# Patient Record
Sex: Female | Born: 2000 | Race: White | Hispanic: No | Marital: Single | Smoking: Never smoker
Health system: Southern US, Community
[De-identification: ages and names within clinical notes are randomized; demographics above are authoritative.]

## PROBLEM LIST (undated history)

## (undated) DIAGNOSIS — E282 Polycystic ovarian syndrome: Secondary | ICD-10-CM

## (undated) DIAGNOSIS — G8194 Hemiplegia, unspecified affecting left nondominant side: Secondary | ICD-10-CM

## (undated) DIAGNOSIS — G809 Cerebral palsy, unspecified: Secondary | ICD-10-CM

## (undated) DIAGNOSIS — G919 Hydrocephalus, unspecified: Secondary | ICD-10-CM

---
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
## 2021-04-09 ENCOUNTER — Ambulatory Visit: Payer: Self-pay | Admitting: Family Medicine

## 2021-04-09 NOTE — Progress Notes (Deleted)
      Established patient visit   Patient: Jasmin Reynolds   DOB: November 02, 2000   20 y.o. Female  MRN: [id number] Visit Date: 04/09/2021  Today's healthcare provider: Wilhemena Durie, MD   No chief complaint on file.  Subjective    HPI  Skin Lesion  {Show patient history (optional):23778}   Medications: No outpatient medications prior to visit.   No facility-administered medications prior to visit.    Review of Systems  Skin:  Negative for color change, pallor, rash and wound.  Allergic/Immunologic: Negative for environmental allergies.   {Labs  Heme  Chem  Endocrine  Serology  Results Review (optional):23779}   Objective    There were no vitals taken for this visit. {Show previous vital signs (optional):23777}   Physical Exam  ***  No results found for any visits on 04/09/21.  Assessment & Plan     ***  No follow-ups on file.      {provider attestation***:1}   Wilhemena Durie, MD  [facility] [phone] (phone) [phone] (fax)  [place]

## 2021-07-28 ENCOUNTER — Other Ambulatory Visit: Payer: Self-pay

## 2021-07-28 ENCOUNTER — Encounter: Payer: Self-pay | Admitting: Family Medicine

## 2021-07-28 ENCOUNTER — Ambulatory Visit (INDEPENDENT_AMBULATORY_CARE_PROVIDER_SITE_OTHER): Payer: Medicaid Other | Admitting: Family Medicine

## 2021-07-28 VITALS — BP 118/84 | HR 94 | Resp 16 | Wt 182.0 lb

## 2021-07-28 DIAGNOSIS — H6123 Impacted cerumen, bilateral: Secondary | ICD-10-CM

## 2021-07-28 DIAGNOSIS — D229 Melanocytic nevi, unspecified: Secondary | ICD-10-CM

## 2021-07-28 DIAGNOSIS — G809 Cerebral palsy, unspecified: Secondary | ICD-10-CM | POA: Diagnosis not present

## 2021-07-28 DIAGNOSIS — G919 Hydrocephalus, unspecified: Secondary | ICD-10-CM

## 2021-07-28 DIAGNOSIS — E282 Polycystic ovarian syndrome: Secondary | ICD-10-CM

## 2021-07-28 MED ORDER — METFORMIN HCL 1000 MG PO TABS: 1000.0000 mg | ORAL_TABLET | Freq: Two times a day (BID) | ORAL | 1 refills | Status: DC

## 2021-07-28 NOTE — Progress Notes (Signed)
I,April Miller,acting as a scribe for Wilhemena Durie, MD.,have documented all relevant documentation on the behalf of Wilhemena Durie, MD,as directed by  Wilhemena Durie, MD while in the presence of Wilhemena Durie, MD.   Established patient visit   Patient: Jasmin Reynolds   DOB: [birth year]   20 y.o. Female  MRN: [id number] Visit Date: 07/28/2021  Today's healthcare provider: Wilhemena Durie, MD   No chief complaint on file.  Subjective    HPI   Patient is here to get referrals to Neurology, Endocrinology, and Dermatology ( Skin).  Patient has a history of infantile cerebral palsy and hydrocephalus along with PCOS.  She is brought in by her mother today.  They state her ears are little congested without discomfort. She has some moles for which she would like to see dermatology.  She has seen them in the past.  Would write referral for her PCOS to endocrine and to neurology/neurosurgery for cerebral palsy/hydrocephalus/shunt. Overall she is feeling well today and is in good spirits.   Medications: No outpatient medications prior to visit.   No facility-administered medications prior to visit.    Review of Systems      Objective    BP 118/84 (BP Location: Right Arm, Patient Position: Sitting, Cuff Size: Large)   Pulse 94   Resp 16   Wt 182 lb (82.6 kg)   SpO2 99%  BP Readings from Last 3 Encounters:  07/28/21 118/84  10/30/19 128/82   Wt Readings from Last 3 Encounters:  07/28/21 182 lb (82.6 kg) (95 %, Z= 1.64)*  10/30/19 175 lb 6.4 oz (79.6 kg) (95 %, Z= 1.60)*   * Growth percentiles are based on CDC (Girls, 2-20 Years) data.      Physical Exam Vitals reviewed.  HENT:     Head: Atraumatic.     Right Ear: External ear normal.     Left Ear: External ear normal.     Ears:     Comments: Both EACs partially occluded with cerumen    Mouth/Throat:     Pharynx: Oropharynx is clear.  Eyes:     General: No scleral icterus.     Conjunctiva/sclera: Conjunctivae normal.  Cardiovascular:     Rate and Rhythm: Normal rate and regular rhythm.     Heart sounds: Normal heart sounds.  Pulmonary:     Effort: Pulmonary effort is normal.     Breath sounds: Normal breath sounds.  Abdominal:     General: Bowel sounds are normal.     Palpations: Abdomen is soft.     Tenderness: There is no abdominal tenderness.  Skin:    General: Skin is warm and dry.  Neurological:     Mental Status: She is alert and oriented to person, place, and time. Mental status is at baseline.  Psychiatric:        Mood and Affect: Mood normal.        Behavior: Behavior normal.      No results found for any visits on 07/28/21.  Assessment & Plan     1. 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 PCOS (polycystic ovarian syndrome) Restart metformin and refer to endocrinology - Ambulatory referral to Endocrinology  3. Numerous moles Refer to dermatology - Ambulatory referral to Dermatology  4. Hydrocephalus with operating shunt [facility]) Refer back to neurology for guidance with treatment. - Ambulatory referral to Neurology  5. Excessive cerumen in  both ear canals Irrigated.  Mother is given instructions with [facility]   No follow-ups on file.      I, Wilhemena Durie, MD, have reviewed all documentation for this visit. The documentation on 07/30/21 for the exam, diagnosis, procedures, and orders are all accurate and complete.    Io Dieujuste Cranford Mon, MD  [facility] [phone] (phone) [phone] (fax)  [place]

## 2021-07-28 NOTE — Patient Instructions (Signed)
TRY OVER-THE-COUNTER DEBROX FOR EAR WAX.

## 2021-09-26 ENCOUNTER — Ambulatory Visit
Admission: RE | Admit: 2021-09-26 | Discharge: 2021-09-26 | Disposition: A | Discharge status: Home / Self Care | Payer: Medicaid Other | Source: Ambulatory Visit | Attending: Family Medicine | Admitting: Family Medicine

## 2021-09-26 VITALS — BP 128/83 | HR 100 | Temp 99.3°F | Resp 16

## 2021-09-26 DIAGNOSIS — N760 Acute vaginitis: Secondary | ICD-10-CM | POA: Diagnosis not present

## 2021-09-26 HISTORY — DX: Hemiplegia, unspecified affecting left nondominant side: G81.94

## 2021-09-26 HISTORY — DX: Hydrocephalus, unspecified: G91.9

## 2021-09-26 HISTORY — DX: Polycystic ovarian syndrome: E28.2

## 2021-09-26 HISTORY — DX: Cerebral palsy, unspecified: G80.9

## 2021-09-26 MED ORDER — FLUCONAZOLE 150 MG PO TABS: 150.0000 mg | ORAL_TABLET | ORAL | 0 refills | Status: DC | PRN

## 2021-09-26 NOTE — Discharge Instructions (Signed)
Vaginal cytology will result within 2-3 days. Someone from our office will contact you if any of the results are abnormal and requires treatment.

## 2021-09-26 NOTE — ED Triage Notes (Signed)
Pt presents with vaginal itching and discharge that started yesterday.

## 2021-09-26 NOTE — ED Provider Notes (Signed)
Roderic Palau    CSN: [id number] Arrival date & time: 09/26/21  8413      History   Chief Complaint Chief Complaint  Patient presents with   Vaginal Discharge   Vaginal Itching    HPI Jasmin Reynolds is a 21 y.o. female.   HPI Patient with history of cerebral palsy and left hemiplegia presents today with vagina itching and irritation. Endorses burning of vaginal skin with urination. (Unable at present to provide a sample). Prior history of BV and vaginitis. No history of UTI.  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 No pertinent surgical history.  OB History   No obstetric history on file.      Home Medications    Prior to Admission medications   Medication Sig Start Date End Date Taking? Authorizing Provider  fluconazole (DIFLUCAN) 150 MG tablet Take 1 tablet (150 mg total) by mouth every three (3) days as needed. 09/26/21  Yes Scot Jun, FNP  metFORMIN (GLUCOPHAGE) 1000 MG tablet Take 1 tablet (1,000 mg total) by mouth 2 (two) times daily with a meal. 07/28/21  Yes Jerrol Banana., MD    Family History History reviewed. No pertinent family history.  Social History Social History   Tobacco Use   Smoking status: Never   Smokeless tobacco: Never  Vaping Use   Vaping Use: Never used  Substance Use Topics   Alcohol use: Never   Drug use: Never     Allergies   Patient has no known allergies.   Review of Systems Review of Systems Pertinent negatives listed in HPI'  Physical Exam Triage Vital Signs ED Triage Vitals  Enc Vitals Group     BP 09/26/21 0943 128/83      Pulse Rate 09/26/21 0943 100     Resp 09/26/21 0943 16     Temp 09/26/21 0943 99.3 F (37.4 C)     Temp Source 09/26/21 0943 Oral     SpO2 09/26/21 0943 96 %     Weight --      Height --      Head Circumference --      Peak Flow --      Pain Score 09/26/21 0937 0     Pain Loc --      Pain Edu? --      Excl. in [place]? --    No data found.  Updated Vital Signs BP 128/83 (BP Location: Right Arm)    Pulse 100    Temp 99.3 F (37.4 C) (Oral)    Resp 16    LMP  (LMP Unknown)    SpO2 96%   Visual Acuity Right Eye Distance:   Left Eye Distance:   Bilateral Distance:    Right Eye Near:   Left Eye Near:    Bilateral Near:     Physical Exam General appearance: Alert, well developed, well nourished, cooperative  Head: Normocephalic, without obvious abnormality, atraumatic Heart: rate and rhythm normal. No gallop or murmurs noted on exam Respiratory: Respirations even and unlabored, normal respiratory rate Abdomen: No CVA tenderness  Extremities:  No gross deformities Skin: Skin color, texture, turgor normal. No rashes seen  Psych: Appropriate mood and affect. Vaginal Cytology Self-Collected   UC Treatments / Results  Labs (all labs ordered are listed, but only abnormal results are displayed) Labs Reviewed  CERVICOVAGINAL ANCILLARY ONLY    EKG   Radiology No results found.  Procedures Procedures (including critical care time)  Medications Ordered in UC Medications - No data to display  Initial Impression / Assessment and Plan / UC Course  I have reviewed the triage vital signs and the nursing notes.  Pertinent labs & imaging results that were available during my care of the patient were reviewed by me and considered in my medical decision making (see chart for details).    Vaginitis, tx with Diflucan  Vaginal Cytology pending. Unable to provide urine. Therefore treating vaginitis symptoms only RTC as needed. Final Clinical Impressions(s) / UC Diagnoses   Final  diagnoses:  Vaginitis and vulvovaginitis     Discharge Instructions      Vaginal cytology will result within 2-3 days. Someone from our office will contact you if any of the results are abnormal and requires treatment.      ED Prescriptions     Medication Sig Dispense Auth. Provider   fluconazole (DIFLUCAN) 150 MG tablet Take 1 tablet (150 mg total) by mouth every three (3) days as needed. 2 tablet Scot Jun, FNP      PDMP not reviewed this encounter.   Scot Jun, FNP 09/26/21 1008

## 2021-09-28 LAB — CERVICOVAGINAL ANCILLARY ONLY
Bacterial Vaginitis (gardnerella): NEGATIVE
Candida Glabrata: NEGATIVE
Candida Vaginitis: NEGATIVE
Chlamydia: NEGATIVE
Comment: NEGATIVE
Comment: NEGATIVE
Comment: NEGATIVE
Comment: NEGATIVE
Comment: NEGATIVE
Comment: NORMAL
Neisseria Gonorrhea: NEGATIVE
Trichomonas: NEGATIVE

## 2021-10-06 ENCOUNTER — Telehealth: Payer: Self-pay

## 2021-10-06 NOTE — Telephone Encounter (Signed)
Copied from [place] [id number]. Topic: General - Other >> Oct 06, 2021  3:40 PM Parke Poisson wrote: Reason for CRM: Referral was sent to [facility] endocrinology for PCOS. They state they are needing lab work with the last 12 months before appointment can be set up,Thanks

## 2021-10-07 ENCOUNTER — Other Ambulatory Visit: Payer: Self-pay

## 2021-10-07 DIAGNOSIS — G919 Hydrocephalus, unspecified: Secondary | ICD-10-CM

## 2021-10-21 ENCOUNTER — Other Ambulatory Visit: Payer: Self-pay | Admitting: Family Medicine

## 2021-10-21 DIAGNOSIS — G919 Hydrocephalus, unspecified: Secondary | ICD-10-CM

## 2021-10-21 NOTE — Telephone Encounter (Signed)
Pt will need updated labs,Thanks

## 2021-10-22 ENCOUNTER — Other Ambulatory Visit: Payer: Self-pay | Admitting: Physician Assistant

## 2021-10-22 DIAGNOSIS — E282 Polycystic ovarian syndrome: Secondary | ICD-10-CM

## 2021-10-22 NOTE — Telephone Encounter (Signed)
Advised 

## 2021-10-22 NOTE — Progress Notes (Signed)
PCOS labs required before she can sched appt with endo

## 2021-11-02 ENCOUNTER — Ambulatory Visit: Payer: Medicaid Other | Admitting: Dermatology

## 2021-12-14 ENCOUNTER — Ambulatory Visit: Payer: Medicaid Other | Admitting: Dermatology

## 2021-12-31 ENCOUNTER — Ambulatory Visit: Payer: Medicaid Other | Admitting: Family Medicine

## 2021-12-31 NOTE — Progress Notes (Deleted)
?  ? ? ?  Established patient visit ? ? ?Patient: Jasmin Reynolds   DOB: 05-Nov-2000   21 y.o. Female  MRN: [id number] ?Visit Date: 12/31/2021 ? ?Today's healthcare provider: Wilhemena Durie, MD  ? ?No chief complaint on file. ? ?Subjective  ?  ?HPI  ?Follow up for Cerebral palsy, unspecified type: ? ?The patient was last seen for this 6 months ago. 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 ? ?She reports {excellent/good/fair/poor:19665} compliance with treatment. ?She feels that condition is {improved/worse/unchanged:3041574}. ?She {is/is not:21021397} having side effects. *** ? ?----------------------------------------------------------------------------------------- ? ? ?Medications: ?Outpatient Medications Prior to Visit  ?Medication Sig  ? fluconazole (DIFLUCAN) 150 MG tablet Take 1 tablet (150 mg total) by mouth every three (3) days as needed.  ? metFORMIN (GLUCOPHAGE) 1000 MG tablet Take 1 tablet (1,000 mg total) by mouth 2 (two) times daily with a meal.  ? ?No facility-administered medications prior to visit.  ? ? ?Review of Systems  ?Constitutional:  Negative for appetite change, chills, fatigue and fever.  ?Respiratory:  Negative for chest tightness and shortness of breath.   ?Cardiovascular:  Negative for chest pain and palpitations.  ?Gastrointestinal:  Negative for abdominal pain, nausea and vomiting.  ?Neurological:  Negative for dizziness and weakness.  ? ?{Labs  Heme  Chem  Endocrine  Serology  Results Review (optional):23779} ?  Objective  ?  ?There were no vitals taken for this visit. ?{Show previous vital signs (optional):23777} ? ?Physical Exam  ?*** ? ?No results found for any visits on 12/31/21. ? Assessment & Plan  ?  ? ?*** ? ?No follow-ups on file.  ?   ? ?{provider attestation***:1} ? ? ?Richard Cranford Mon, MD  ?[facility] ?[phone] (phone) ?[phone] (fax) ? ?[facility] ?

## 2022-01-13 DIAGNOSIS — Z982 Presence of cerebrospinal fluid drainage device: Secondary | ICD-10-CM | POA: Diagnosis not present

## 2022-01-13 DIAGNOSIS — G919 Hydrocephalus, unspecified: Secondary | ICD-10-CM | POA: Diagnosis not present

## 2022-01-13 DIAGNOSIS — G9389 Other specified disorders of brain: Secondary | ICD-10-CM | POA: Diagnosis not present

## 2022-03-29 ENCOUNTER — Ambulatory Visit: Payer: Medicare Other | Admitting: Dermatology

## 2022-06-09 ENCOUNTER — Ambulatory Visit (INDEPENDENT_AMBULATORY_CARE_PROVIDER_SITE_OTHER): Payer: Medicare Other | Admitting: Dermatology

## 2022-06-09 ENCOUNTER — Ambulatory Visit: Payer: Medicare Other | Admitting: Dermatology

## 2022-06-09 DIAGNOSIS — W57XXXA Bitten or stung by nonvenomous insect and other nonvenomous arthropods, initial encounter: Secondary | ICD-10-CM | POA: Diagnosis not present

## 2022-06-09 DIAGNOSIS — L03311 Cellulitis of abdominal wall: Secondary | ICD-10-CM | POA: Diagnosis not present

## 2022-06-09 DIAGNOSIS — S30861A Insect bite (nonvenomous) of abdominal wall, initial encounter: Secondary | ICD-10-CM | POA: Diagnosis not present

## 2022-06-09 MED ORDER — MUPIROCIN 2 % EX OINT: 1.0000 | TOPICAL_OINTMENT | Freq: Two times a day (BID) | CUTANEOUS | 0 refills | Status: DC

## 2022-06-09 MED ORDER — DOXYCYCLINE HYCLATE 100 MG PO TABS: 100.0000 mg | ORAL_TABLET | Freq: Two times a day (BID) | ORAL | 0 refills | Status: AC

## 2022-06-09 NOTE — Patient Instructions (Signed)
Due to recent changes in healthcare laws, you may see results of your pathology and/or laboratory studies on MyChart before the doctors have had a chance to review them. We understand that in some cases there may be results that are confusing or concerning to you. Please understand that not all results are received at the same time and often the doctors may need to interpret multiple results in order to provide you with the best plan of care or course of treatment. Therefore, we ask that you please give us 2 business days to thoroughly review all your results before contacting the office for clarification. Should we see a critical lab result, you will be contacted sooner.   If You Need Anything After Your Visit  If you have any questions or concerns for your doctor, please call our main line at [phone] and press option 4 to reach your doctor's medical assistant. If no one answers, please leave a voicemail as directed and we will return your call as soon as possible. Messages left after 4 pm will be answered the following business day.   You may also send us a message via MyChart. We typically respond to MyChart messages within 1-2 business days.  For prescription refills, please ask your pharmacy to contact our office. Our fax number is [phone].  If you have an urgent issue when the clinic is closed that cannot wait until the next business day, you can page your doctor at the number below.    Please note that while we do our best to be available for urgent issues outside of office hours, we are not available 24/7.   If you have an urgent issue and are unable to reach us, you may choose to seek medical care at your doctor's office, retail clinic, urgent care center, or emergency room.  If you have a medical emergency, please immediately call 911 or go to the emergency department.  Pager Numbers  - Dr. Kowalski: [phone]  - Dr. Moye: [phone]  - Dr. Stewart:  [phone]  In the event of inclement weather, please call our main line at [phone] for an update on the status of any delays or closures.  Dermatology Medication Tips: Please keep the boxes that topical medications come in in order to help keep track of the instructions about where and how to use these. Pharmacies typically print the medication instructions only on the boxes and not directly on the medication tubes.   If your medication is too expensive, please contact our office at [phone] option 4 or send us a message through MyChart.   We are unable to tell what your co-pay for medications will be in advance as this is different depending on your insurance coverage. However, we may be able to find a substitute medication at lower cost or fill out paperwork to get insurance to cover a needed medication.   If a prior authorization is required to get your medication covered by your insurance company, please allow us 1-2 business days to complete this process.  Drug prices often vary depending on where the prescription is filled and some pharmacies may offer cheaper prices.  The website www.goodrx.com contains coupons for medications through different pharmacies. The prices here do not account for what the cost may be with help from insurance (it may be cheaper with your insurance), but the website can give you the price if you did not use any insurance.  - You can print the associated coupon and take it with   your prescription to the pharmacy.  - You may also stop by our office during regular business hours and pick up a GoodRx coupon card.  - If you need your prescription sent electronically to a different pharmacy, notify our office through [place] MyChart or by phone at [phone] option 4.     Si Usted Necesita Algo Despus de Su Visita  Tambin puede enviarnos un mensaje a travs de MyChart. Por lo general respondemos a los mensajes de MyChart en el transcurso de 1 a 2  das hbiles.  Para renovar recetas, por favor pida a su farmacia que se ponga en contacto con nuestra oficina. Nuestro nmero de fax es el [phone].  Si tiene un asunto urgente cuando la clnica est cerrada y que no puede esperar hasta el siguiente da hbil, puede llamar/localizar a su doctor(a) al nmero que aparece a continuacin.   Por favor, tenga en cuenta que aunque hacemos todo lo posible para estar disponibles para asuntos urgentes fuera del horario de oficina, no estamos disponibles las 24 horas del da, los 7 das de la semana.   Si tiene un problema urgente y no puede comunicarse con nosotros, puede optar por buscar atencin mdica  en el consultorio de su doctor(a), en una clnica privada, en un centro de atencin urgente o en una sala de emergencias.  Si tiene una emergencia mdica, por favor llame inmediatamente al 911 o vaya a la sala de emergencias.  Nmeros de bper  - Dr. Kowalski: [phone]  - Dra. Moye: [phone]  - Dra. Stewart: [phone]  En caso de inclemencias del tiempo, por favor llame a nuestra lnea principal al [phone] para una actualizacin sobre el estado de cualquier retraso o cierre.  Consejos para la medicacin en dermatologa: Por favor, guarde las cajas en las que vienen los medicamentos de uso tpico para ayudarle a seguir las instrucciones sobre dnde y cmo usarlos. Las farmacias generalmente imprimen las instrucciones del medicamento slo en las cajas y no directamente en los tubos del medicamento.   Si su medicamento es muy caro, por favor, pngase en contacto con nuestra oficina llamando al [phone] y presione la opcin 4 o envenos un mensaje a travs de MyChart.   No podemos decirle cul ser su copago por los medicamentos por adelantado ya que esto es diferente dependiendo de la cobertura de su seguro. Sin embargo, es posible que podamos encontrar un medicamento sustituto a menor costo o llenar un formulario para que el  seguro cubra el medicamento que se considera necesario.   Si se requiere una autorizacin previa para que su compaa de seguros cubra su medicamento, por favor permtanos de 1 a 2 das hbiles para completar este proceso.  Los precios de los medicamentos varan con frecuencia dependiendo del lugar de dnde se surte la receta y alguna farmacias pueden ofrecer precios ms baratos.  El sitio web www.goodrx.com tiene cupones para medicamentos de diferentes farmacias. Los precios aqu no tienen en cuenta lo que podra costar con la ayuda del seguro (puede ser ms barato con su seguro), pero el sitio web puede darle el precio si no utiliz ningn seguro.  - Puede imprimir el cupn correspondiente y llevarlo con su receta a la farmacia.  - Tambin puede pasar por nuestra oficina durante el horario de atencin regular y recoger una tarjeta de cupones de GoodRx.  - Si necesita que su receta se enve electrnicamente a una farmacia diferente, informe a nuestra oficina a travs de MyChart de [place]   o por telfono llamando al [phone] y presione la opcin 4.  

## 2022-06-09 NOTE — Progress Notes (Unsigned)
   New Patient Visit  Subjective  Jasmin Reynolds is a 21 y.o. female who presents for the following: Other (Sore on lower abdomen - just noticed yesterday).  Accompanied by father  The following portions of the chart were reviewed this encounter and updated as appropriate:   Tobacco  Allergies  Meds  Problems  Med Hx  Surg Hx  Fam Hx      Review of Systems:  No other skin or systemic complaints except as noted in HPI or Assessment and Plan.  Objective  Well appearing patient in no apparent distress; mood and affect are within normal limits.  A focused examination was performed including abdomen. Relevant physical exam findings are noted in the Assessment and Plan.   Assessment & Plan  Insect bite of abdomen with local reaction, initial encounter Left Abdomen (side) - Lower Bite reaction with Furunculosis/Folliculitis / Cellulitis  Start : Doxycycline 100 mg 1 po bid with food and plenty of fluid x 10 days,  Mupirocin oint bid  doxycycline (VIBRA-TABS) 100 MG tablet - Left Abdomen (side) - Lower Take 1 tablet (100 mg total) by mouth 2 (two) times daily. With food and plenty of fluid  mupirocin ointment (BACTROBAN) 2 % - Left Abdomen (side) - Lower Apply 1 Application topically 2 (two) times daily.  Return if symptoms worsen or fail to improve.  I, Ashok Cordia, CMA, am acting as scribe for Sarina Ser, MD . Documentation: I have reviewed the above documentation for accuracy and completeness, and I agree with the above.  Sarina Ser, MD

## 2022-06-10 ENCOUNTER — Encounter: Payer: Self-pay | Admitting: Dermatology

## 2022-08-20 NOTE — Progress Notes (Deleted)
     I,Axyl Sitzman R Aveya Beal,acting as a Education administrator for [employer], PA-C.,have documented all relevant documentation on the behalf of Mikey Kirschner, PA-C,as directed by  Mikey Kirschner, PA-C while in the presence of Mikey Kirschner, PA-C.   Established patient visit   Patient: Jasmin Reynolds   DOB: 03/10/2001   21 y.o. Female  MRN: [id number] Visit Date: 08/23/2022  Today's healthcare provider: Mikey Kirschner, PA-C   No chief complaint on file.  Subjective    HPI   Weight Management   The patient was last seen for this {NUMBERS 1-12:18279} {days/wks/mos/yrs:310907} ago. Changes made at last visit include ***.  She reports {excellent/good/fair/poor:19665} compliance with treatment. She feels that condition is {improved/worse/unchanged:3041574}. She {is/is not:21021397} having side effects. ***  -----------------------------------------------------------------------------------------  Medications: Outpatient Medications Prior to Visit  Medication Sig   doxycycline (VIBRA-TABS) 100 MG tablet Take 1 tablet (100 mg total) by mouth 2 (two) times daily. With food and plenty of fluid   fluconazole (DIFLUCAN) 150 MG tablet Take 1 tablet (150 mg total) by mouth every three (3) days as needed.   metFORMIN (GLUCOPHAGE) 1000 MG tablet Take 1 tablet (1,000 mg total) by mouth 2 (two) times daily with a meal.   mupirocin ointment (BACTROBAN) 2 % Apply 1 Application topically 2 (two) times daily.   No facility-administered medications prior to visit.    Review of Systems  {Labs  Heme  Chem  Endocrine  Serology  Results Review (optional):23779}   Objective    There were no vitals taken for this visit. {Show previous vital signs (optional):23777}  Physical Exam  ***  No results found for any visits on 08/23/22.  Assessment & Plan     ***  No follow-ups on file.      {provider attestation***:1}   Mikey Kirschner, PA-C  [facility] [phone]  (phone) [phone] (fax)  [place]

## 2022-08-23 ENCOUNTER — Ambulatory Visit: Payer: Medicaid Other | Admitting: Physician Assistant

## 2022-12-24 ENCOUNTER — Ambulatory Visit: Payer: Medicare Other | Admitting: Physician Assistant

## 2023-01-05 NOTE — Progress Notes (Unsigned)
    Vivien Rota DeSanto,acting as a scribe for Alfredia Ferguson, PA-C.,have documented all relevant documentation on the behalf of Alfredia Ferguson, PA-C,as directed by  Alfredia Ferguson, PA-C while in the presence of Alfredia Ferguson, PA-C.    Established patient visit   Patient: Jasmin Reynolds   DOB: 2001/06/21   22 y.o. Female  MRN: [id number] Visit Date: 01/06/2023  Today's healthcare provider: Alfredia Ferguson, PA-C   No chief complaint on file.  Subjective    HPI  ***  Medications: Outpatient Medications Prior to Visit  Medication Sig   fluconazole (DIFLUCAN) 150 MG tablet Take 1 tablet (150 mg total) by mouth every three (3) days as needed.   metFORMIN (GLUCOPHAGE) 1000 MG tablet Take 1 tablet (1,000 mg total) by mouth 2 (two) times daily with a meal.   mupirocin ointment (BACTROBAN) 2 % Apply 1 Application topically 2 (two) times daily.   No facility-administered medications prior to visit.    Review of Systems  {Labs  Heme  Chem  Endocrine  Serology  Results Review (optional):23779}   Objective    There were no vitals taken for this visit. {Show previous vital signs (optional):23777}  Physical Exam  ***  No results found for any visits on 01/06/23.  Assessment & Plan     ***  No follow-ups on file.      {provider attestation***:1}   Alfredia Ferguson, PA-C  [facility] [phone] (phone) [phone] (fax)  [facility]

## 2023-01-06 ENCOUNTER — Encounter: Payer: Self-pay | Admitting: Physician Assistant

## 2023-01-06 ENCOUNTER — Ambulatory Visit (INDEPENDENT_AMBULATORY_CARE_PROVIDER_SITE_OTHER): Payer: Medicare Other | Admitting: Physician Assistant

## 2023-01-06 VITALS — BP 126/88 | HR 106 | Ht 61.0 in | Wt 193.0 lb

## 2023-01-06 DIAGNOSIS — R7303 Prediabetes: Secondary | ICD-10-CM

## 2023-01-06 DIAGNOSIS — G8194 Hemiplegia, unspecified affecting left nondominant side: Secondary | ICD-10-CM | POA: Diagnosis not present

## 2023-01-06 DIAGNOSIS — E669 Obesity, unspecified: Secondary | ICD-10-CM | POA: Diagnosis not present

## 2023-01-06 DIAGNOSIS — G919 Hydrocephalus, unspecified: Secondary | ICD-10-CM | POA: Diagnosis not present

## 2023-01-06 DIAGNOSIS — R531 Weakness: Secondary | ICD-10-CM

## 2023-01-06 NOTE — Assessment & Plan Note (Signed)
Historically. Will repeat labs, A1c cmp, cmc, lipids No longer on metformin

## 2023-01-06 NOTE — Assessment & Plan Note (Signed)
From hydrocephalus vs cerebral palsy? Unsure if exact pt history. Per mother will send to ortho but advised she would benefit  from physical therapy /bracing. We will hold on referral until discussing with ortho

## 2023-01-07 LAB — COMPREHENSIVE METABOLIC PANEL
ALT: 23 IU/L (ref 0–32)
AST: 21 IU/L (ref 0–40)
Albumin/Globulin Ratio: 1.5 (ref 1.2–2.2)
Albumin: 4.4 g/dL (ref 4.0–5.0)
Alkaline Phosphatase: 86 IU/L (ref 44–121)
BUN/Creatinine Ratio: 13 (ref 9–23)
BUN: 11 mg/dL (ref 6–20)
Bilirubin Total: 0.3 mg/dL (ref 0.0–1.2)
CO2: 20 mmol/L (ref 20–29)
Calcium: 9.9 mg/dL (ref 8.7–10.2)
Chloride: 104 mmol/L (ref 96–106)
Creatinine, Ser: 0.87 mg/dL (ref 0.57–1.00)
Globulin, Total: 3 g/dL (ref 1.5–4.5)
Glucose: 86 mg/dL (ref 70–99)
Potassium: 4.5 mmol/L (ref 3.5–5.2)
Sodium: 140 mmol/L (ref 134–144)
Total Protein: 7.4 g/dL (ref 6.0–8.5)
eGFR: 97 mL/min/{1.73_m2} (ref >59)

## 2023-01-07 LAB — CBC WITH DIFFERENTIAL/PLATELET
Basophils Absolute: 0.1 10*3/uL (ref 0.0–0.2)
Basos: 1 %
EOS (ABSOLUTE): 0.1 10*3/uL (ref 0.0–0.4)
Eos: 1 %
Hematocrit: 38.3 % (ref 34.0–46.6)
Hemoglobin: 12.1 g/dL (ref 11.1–15.9)
Immature Grans (Abs): 0 10*3/uL (ref 0.0–0.1)
Immature Granulocytes: 0 %
Lymphocytes Absolute: 2.6 10*3/uL (ref 0.7–3.1)
Lymphs: 25 %
MCH: 26.2 pg — ABNORMAL LOW (ref 26.6–33.0)
MCHC: 31.6 g/dL (ref 31.5–35.7)
MCV: 83 fL (ref 79–97)
Monocytes Absolute: 0.9 10*3/uL (ref 0.1–0.9)
Monocytes: 9 %
Neutrophils Absolute: 6.8 10*3/uL (ref 1.4–7.0)
Neutrophils: 64 %
Platelets: 335 10*3/uL (ref 150–450)
RBC: 4.61 x10E6/uL (ref 3.77–5.28)
RDW: 14.3 % (ref 11.7–15.4)
WBC: 10.5 10*3/uL (ref 3.4–10.8)

## 2023-01-07 LAB — LIPID PANEL
Chol/HDL Ratio: 4.1 ratio (ref 0.0–4.4)
Cholesterol, Total: 227 mg/dL — ABNORMAL HIGH (ref 100–199)
HDL: 55 mg/dL (ref >39)
LDL Chol Calc (NIH): 140 mg/dL — ABNORMAL HIGH (ref 0–99)
Triglycerides: 180 mg/dL — ABNORMAL HIGH (ref 0–149)
VLDL Cholesterol Cal: 32 mg/dL (ref 5–40)

## 2023-01-07 LAB — HEMOGLOBIN A1C
Est. average glucose Bld gHb Est-mCnc: 117 mg/dL
Hgb A1c MFr Bld: 5.7 % — ABNORMAL HIGH (ref 4.8–5.6)

## 2023-01-11 ENCOUNTER — Other Ambulatory Visit: Payer: Self-pay | Admitting: Physician Assistant

## 2023-01-11 DIAGNOSIS — R7303 Prediabetes: Secondary | ICD-10-CM

## 2023-01-11 MED ORDER — METFORMIN HCL ER 500 MG PO TB24
500.0000 mg | ORAL_TABLET | Freq: Every day | ORAL | 1 refills | Status: AC
Start: 2023-01-11 — End: ?

## 2023-01-11 NOTE — Progress Notes (Signed)
sent 

## 2023-01-20 ENCOUNTER — Telehealth: Payer: Self-pay

## 2023-01-20 NOTE — Telephone Encounter (Signed)
Copied from CRM [id number]. Topic: Referral - Status >> Jan 20, 2023  1:12 PM Everette C wrote: Reason for CRM: with Herbert Seta with [facility] has called to share that Neurosurgery would be a better option for the patient regarding Referral # [id number]  Please contact further if needed

## 2023-01-24 ENCOUNTER — Other Ambulatory Visit: Payer: Self-pay | Admitting: Physician Assistant

## 2023-01-24 DIAGNOSIS — G8194 Hemiplegia, unspecified affecting left nondominant side: Secondary | ICD-10-CM

## 2023-01-24 DIAGNOSIS — G809 Cerebral palsy, unspecified: Secondary | ICD-10-CM

## 2023-01-24 DIAGNOSIS — G919 Hydrocephalus, unspecified: Secondary | ICD-10-CM

## 2023-01-24 NOTE — Telephone Encounter (Signed)
Referral placed.

## 2023-02-02 ENCOUNTER — Other Ambulatory Visit: Payer: Self-pay | Admitting: Physician Assistant

## 2023-02-02 DIAGNOSIS — R531 Weakness: Secondary | ICD-10-CM

## 2023-02-02 DIAGNOSIS — G809 Cerebral palsy, unspecified: Secondary | ICD-10-CM

## 2023-02-17 ENCOUNTER — Telehealth: Payer: Self-pay | Admitting: Physician Assistant

## 2023-02-17 NOTE — Telephone Encounter (Signed)
Contacted Dynetta Bayliff to schedule their annual wellness visit. Welcome to Medicare visit Due by 03/21/2023.  Thank you,  [facility] Support [facility] Direct dial  [phone]

## 2023-02-18 ENCOUNTER — Telehealth: Payer: Self-pay

## 2023-02-18 NOTE — Telephone Encounter (Signed)
LVM for patient to call back [phone], or to call PCP office to schedule follow up apt. AS, CMA  

## 2023-04-26 ENCOUNTER — Encounter: Payer: Self-pay | Admitting: Physical Medicine and Rehabilitation

## 2023-06-22 ENCOUNTER — Encounter
Payer: Medicare Other | Attending: Physical Medicine and Rehabilitation | Admitting: Physical Medicine and Rehabilitation

## 2023-06-22 VITALS — BP 125/92 | HR 103 | Ht 61.0 in | Wt 194.0 lb

## 2023-06-22 DIAGNOSIS — R269 Unspecified abnormalities of gait and mobility: Secondary | ICD-10-CM | POA: Insufficient documentation

## 2023-06-22 DIAGNOSIS — G802 Spastic hemiplegic cerebral palsy: Secondary | ICD-10-CM | POA: Insufficient documentation

## 2023-06-22 DIAGNOSIS — G8194 Hemiplegia, unspecified affecting left nondominant side: Secondary | ICD-10-CM | POA: Diagnosis not present

## 2023-06-22 NOTE — Patient Instructions (Signed)
  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  Please bring your current AFO with you to the first appointment, since insurance may not approve a new device within 5 years if this one is still usable, so they can see if it needs adjusted or completely redone.  Return to me in the next month for Botox for the left leg, to work on improving range of motion in her ankle and hopefully helping with gait.  I   am sending a referral for aqua therapy today; please let them know when the Botox appointment is so we can schedule therapies within 2 weeks of that.  See me for general follow-up in 3 months.  Obesity, morbid (HCC) -     Amb ref to Medical Nutrition Therapy-MNT -     Ambulatory referral to Physical Therapy  Physical therapy referral as above.  Provided handouts on nutrition for weight loss today, and provided a referral to medical nutrition and weight loss clinic in [place].  Should be getting a call from them about scheduling an appointment.  I recommend starting a calorie tracker such as my fitness pal to keep track of daily food intakes.  Gait difficulty -     Ambulatory referral to Physical Therapy  Keep active is much as possible, attend physical therapy as above, and hopefully between this and Botox, along with remolding the AFO, you should feel more stable while walking and be able to walk for longer periods more comfortably.

## 2023-06-22 NOTE — Progress Notes (Signed)
Subjective:    Patient ID: Jasmin Reynolds, female    DOB: [birth year], 22 y.o.   MRN: [id number]  HPI  Jasmin Reynolds is a 22 y.o. year old female  who  has a past medical history of Cerebral palsy (HCC), Hydrocephalus (HCC), Left hemiplegia (HCC), and PCOS (polycystic ovarian syndrome).   They are presenting to PM&R clinic as a new patient for treatment of CP (left hemi, spastic) related weakness and pain . They were referred by Alfredia Ferguson PA-C . Seen with mom and dad today; per parents concerned about weight gain and lack of mobility.  Chief complaint: Weight gain Duration: "Really bad since high school graduation". Has gained about 30-40 lbs since then. Increase over time.  Description: Carbs "all day long"; do not eat a lot of fruit. Loves vegetables like carrots, green beans. Never did calorie tracking.  Exacerbated by: inactivity; she likes "just dance" on the switch. She used to swim for a swim team, she was in the special olympics. She swims occassionally still but "basks" more than doing laps.  Remitted by: none  Medications tried: On metformin for a bit, but parents were not comfortable with  Other non-invasive interventions tried: none; never been to dietician, never been on a dedicated gym schedule but they are looking at a trainer in [place].  Injections: none Relevant Surgeries: none  2nd complaint: Mobility issues Duration: Chronic, due to left hemiplegia from CP Description: Can move without AD, even "hop-runs" per mom and dad.  Exacerbated by: Ill-fitting AFO; last fit 2 years ago, molded thermoplastic. Patient poorly motivated to participate. Fatigue when left leg gets overworked.  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 She sits only for 10 minute break.  Injections: Had botox injections  and casting when she was in middle school; didn't seem to help per mom and dad.   Pain Inventory Average Pain  no pain Pain Right Now  no pain My pain is  no pain  LOCATION OF PAIN  no pain  BOWEL Number of stools per week: 7-10 Oral laxative use No  Type of laxative . Enema or suppository use No  History of colostomy No  Incontinent No   BLADDER Normal In and out cath, frequency . Able to self cath  . Bladder incontinence Yes  Frequent urination Yes  Leakage with coughing Yes  Difficulty starting stream No  Incomplete bladder emptying Yes    Mobility walk without assistance how many minutes can you walk? 20 ability to climb steps?  yes do you drive?  no  Function employed # of hrs/week 12 disabled: date disabled 11/16/21 I need assistance with the following:  dressing, bathing, meal prep, household duties, and shopping  Neuro/Psych bladder control problems  Prior Studies New pt  Physicians involved in your care New pt   No family history on file. Social History   Socioeconomic History   Marital status: Single    Spouse name: Not on file   Number of children: Not on file   Years of education: Not on file   Highest education level: Not on file  Occupational History   Not on file  Tobacco Use   Smoking status: Never   Smokeless tobacco: Never  Vaping Use   Vaping status: Never Used  Substance and Sexual Activity   Alcohol use: Never  Drug use: Never   Sexual activity: Not on file  Other Topics Concern   Not on file  Social History Narrative   Not on file   Social Determinants of Health   Financial Resource Strain: Not on file  Food Insecurity: Not on file  Transportation Needs: Not on file  Physical Activity: Not on file  Stress: Not on file  Social Connections: Not on file   No past surgical history on file. Past Medical History:  Diagnosis Date   Cerebral palsy (HCC)    Hydrocephalus (HCC)    Left hemiplegia (HCC)    PCOS  (polycystic ovarian syndrome)    BP (!) 125/92   Pulse (!) 103   Ht 5\' 1"  (1.549 m)   Wt 194 lb (88 kg)   SpO2 90% Comment: small nail  BMI 36.66 kg/m   Opioid Risk Score:   Fall Risk Score:  `1  Depression screen Jasmin Reynolds 2/9     01/06/2023   11:11 AM  Depression screen PHQ 2/9  Decreased Interest 0  Down, Depressed, Hopeless 1  PHQ - 2 Score 1  Altered sleeping 1  Tired, decreased energy 2  Change in appetite 2  Feeling bad or failure about yourself  0  Trouble concentrating 0  Moving slowly or fidgety/restless 0  Suicidal thoughts 0  PHQ-9 Score 6  Difficult doing work/chores Not difficult at all     Review of Systems  All other systems reviewed and are negative.     Objective:   Physical Exam   PE: Constitution: Appropriate appearance for age. No apparent distress  +Obese Resp: No respiratory distress. No accessory muscle usage. on RA and CTAB Cardio: Well perfused appearance. No peripheral edema. Abdomen: Nondistended. Nontender.   Psych: Appropriate mood and affect. Mild tearfulness discussing weight and medical issues.  Neuro: AAOx4. Mild apparent cognitive deficits; language and comprehension WNL.   Neurologic Exam:   Babinsky: flexor responses b/l.   Hoffmans: negative b/l Sensory exam: revealed normal sensation in all dermatomal regions in bilateral upper extremities and bilateral lower extremities Motor exam: strength 5-/5 throughout bilateral upper extremities and right lower extremity; LLE 4/5 HF, KE, DF and PF Coordination: Fine motor coordination was reduced in bilateral lower extremities  Gait: Scissoriung gait with flexor synergy in LUE; long strides with L foot, mild foot drop  Tone: L MAS 1 finger flexors L Mas 3 foot inverters, MAS 4 ankle DF -5 Rom; Mas 2 knee flexor      Assessment & Plan:   Jasmin Reynolds is a 22 y.o. year old female  who  has a past medical history of Cerebral palsy (HCC), Hydrocephalus (HCC), Left hemiplegia (HCC),  and PCOS (polycystic ovarian syndrome).   They are presenting to PM&R clinic as a new patient for treatment of obesity and mobility deficits due to L spastic hemiplegic CP .    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  Please bring your current AFO with you to the first appointment, since insurance may not approve a new device within 5 years if this one is still usable, so they can see if it needs adjusted or completely redone.  Return to me in the next month for Botox for the left leg, to work on improving range of motion in her ankle and hopefully helping with gait.  I  I am sending a referral for aqua therapy today; please let them know when the Botox appointment is so we can schedule therapies within 2 weeks of that.  See me for general follow-up in 3 months.  Obesity, morbid (HCC) -     Amb ref to Medical Nutrition Therapy-MNT -     Ambulatory referral to Physical Therapy  Physical therapy referral as above.  Provided handouts on nutrition for weight loss today, and provided a referral to medical nutrition and weight loss clinic in [place].  Should be getting a call from them about scheduling an appointment.  I recommend starting a calorie tracker such as my fitness pal to keep track of daily food intakes.  Gait difficulty -     Ambulatory referral to Physical Therapy  Keep active is much as possible, attend physical therapy as above, and hopefully between this and Botox, along with remolding the AFO, you should feel more stable while walking and be able to walk for longer periods more comfortably.

## 2023-07-20 ENCOUNTER — Encounter: Payer: Medicare Other | Admitting: Physical Medicine and Rehabilitation

## 2023-09-28 ENCOUNTER — Encounter
Payer: Medicare Other | Attending: Physical Medicine and Rehabilitation | Admitting: Physical Medicine and Rehabilitation

## 2023-10-17 ENCOUNTER — Encounter: Payer: Medicare Other | Admitting: Physical Medicine and Rehabilitation

## 2023-10-18 ENCOUNTER — Ambulatory Visit: Payer: Medicare Other | Attending: Physical Medicine and Rehabilitation | Admitting: Physical Therapy

## 2023-10-18 DIAGNOSIS — G8194 Hemiplegia, unspecified affecting left nondominant side: Secondary | ICD-10-CM | POA: Insufficient documentation

## 2023-10-18 DIAGNOSIS — M25672 Stiffness of left ankle, not elsewhere classified: Secondary | ICD-10-CM | POA: Diagnosis present

## 2023-10-18 DIAGNOSIS — G802 Spastic hemiplegic cerebral palsy: Secondary | ICD-10-CM | POA: Diagnosis not present

## 2023-10-18 DIAGNOSIS — M6281 Muscle weakness (generalized): Secondary | ICD-10-CM | POA: Insufficient documentation

## 2023-10-18 DIAGNOSIS — R2689 Other abnormalities of gait and mobility: Secondary | ICD-10-CM | POA: Diagnosis present

## 2023-10-18 DIAGNOSIS — R269 Unspecified abnormalities of gait and mobility: Secondary | ICD-10-CM | POA: Insufficient documentation

## 2023-10-18 NOTE — Patient Instructions (Signed)
Aquatic Therapy: What to Expect!  Where:  [facility] [address] [place], [place]  [address] [phone]  NOTE:  You will receive an automated phone message reminding you of your appointment and it will say the appointment is at the [facility] on [address]  We are working to fix this- just know that you will meet [place] at the pool!  How to Prepare: Please make sure you drink 8 ounces of water about one hour prior to your pool session A caregiver MUST attend the entire session with the patient.  The caregiver will be responsible for assisting with dressing as well as any toileting needs.  If the patient will be doing a home program this should likely be the person who will assist as well.  Patients must wear either their street shoes or pool shoes until they are ready to enter the pool with the therapist.  Patients must also wear either street shoes or pool shoes once exiting the pool to walk to the locker room.  This will helps [place] prevent slips and falls.  Please arrive 15 minutes early to prepare for your pool therapy session Sign in at the front desk on the clipboard marked for [place] You may use the locker rooms on your right and then enter directly into the recreation pool (NOT the competition pool) Please make sure to attend to any toileting needs prior to entering the pool Please be dressed in your swim suit and on the pool deck at least 5 minutes before your appointment Once on the pool deck your therapist will ask you to sign the Patient  Consent and Assignment of Benefits form Your therapist may take your blood pressure prior to, during and after your session if indicated  About the pool  and parking: Entering the pool Your therapist will assist you; there are 2 ways to enter:  stairs with railings or with a chair lift.   Your therapist will determine the most appropriate way for you. Water temperature is usually between 86-87 degrees There  may be other swimmers in the pool at the same time Parking is free.   Contact Info:     Appointments: [facility]  All sessions are 45 minutes   [address]  [address]     Please call the [facility] if   [place], [place]   [address]    you need to cancel or reschedule an appointment.  [phone]

## 2023-10-18 NOTE — Therapy (Signed)
OUTPATIENT PHYSICAL THERAPY NEURO EVALUATION   Patient Name: Jasmin Reynolds MRN: [id number] DOB:17-Jun-2001, 23 y.o., female Today's Date: 10/18/2023   PCP: Alfredia Ferguson, PA-C  REFERRING PROVIDER: Angelina Sheriff, DO  END OF SESSION:  PT End of Session - 10/18/23 1219     Visit Number 1    Number of Visits 7    Date for PT Re-Evaluation 12/17/23    Authorization Type MEDICARE PART A AND B    PT Start Time 1103    PT Stop Time 1140    PT Time Calculation (min) 37 min    Activity Tolerance Patient tolerated treatment well    Behavior During Therapy WFL for tasks assessed/performed             Past Medical History:  Diagnosis Date   Cerebral palsy (HCC)    Hydrocephalus (HCC)    Left hemiplegia (HCC)    PCOS (polycystic ovarian syndrome)    No past surgical history on file. Patient Active Problem List   Diagnosis Date Noted   Obesity, morbid (HCC) 06/22/2023   Gait difficulty 06/22/2023   Prediabetes 01/06/2023   Hydrocephalus (HCC) 05/04/2019   Cerebral palsy (HCC) 05/04/2019   Left hemiplegia (HCC) 05/04/2019   Legally blind 05/04/2019   PCOS (polycystic ovarian syndrome) 05/04/2019    ONSET DATE: 06/22/2023  REFERRING DIAG: G81.94 (ICD-10-CM) - Left hemiplegia (HCC) G80.2 (ICD-10-CM) - Spastic hemiplegic cerebral palsy (HCC) E66.01 (ICD-10-CM) - Obesity, morbid (HCC) R26.9 (ICD-10-CM) - Gait difficulty  THERAPY DIAG:  Other abnormalities of gait and mobility  Muscle weakness (generalized)  Stiffness of left ankle, not elsewhere classified  Rationale for Evaluation and Treatment: Rehabilitation  SUBJECTIVE:                                                                                                                                                                                             SUBJECTIVE STATEMENT:   GOES BY JESSIE   Per mom, hoping to find some different exercises to do at home for stretching and to aid in some weight loss. Dr.  Shearon Stalls also recommended the pool. Last saw Dr. Shearon Stalls on 06/22/23 and was recommending Botox in a month to help with walking and ankle mobility. Had serial casting a few times when she was younger. Has an old L AFO, got it before high school and has not worn it for a while. Got a new referral for an AFO from Dr. Shearon Stalls, so still has to make an appt with [facility]. Have not yet heard from the nutritionist that was referred from Dr. Shearon Stalls. No falls, reports balance feels good.  Pt accompanied by: self and Mom  PERTINENT HISTORY: PMH: Cerebral palsy (HCC), Hydrocephalus (HCC), Left hemiplegia (HCC), and PCOS (polycystic ovarian syndrome).     PAIN:  Are you having pain? No  PRECAUTIONS: None   FALLS: Has patient fallen in last 6 months? 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 , and Leisure: likes to make little books, watch TV, playing games   Per mom, gets in the shower on her own and helps with bathing, needs assistance getting out.  Parents do cooking   PATIENT GOALS: Per mom, not to get as winded and tired when walking places.   OBJECTIVE:  Note: Objective measures were completed at Evaluation unless otherwise noted. COGNITION: Overall cognitive status: Within functional limits for tasks assessed   SENSATION: Light touch: WFL  COORDINATION: Heel to shin: slower to perform with LLE    MUSCLE TONE: LLE: Hypertonic   POSTURE: rounded shoulders, forward head, and increased thoracic kyphosis  LOWER EXTREMITY ROM:     Passive  Right Eval Left Eval  Hip flexion WNL WNL  Hip extension    Hip abduction    Hip adduction    Hip internal rotation  WNL WNL  Hip external rotation WNL WNL  Knee flexion    Knee extension    Ankle dorsiflexion  Significantly hypomobile, unable to reach neutral passively  Ankle plantarflexion    Ankle inversion  Pt's L foot held in a contracted inversion position  Ankle eversion  Significantly hypomobile   (Blank rows = not tested)  LOWER EXTREMITY MMT:    MMT Right Eval Left Eval  Hip flexion 5 4  Hip extension    Hip abduction 4+ 4  Hip adduction    Hip internal rotation    Hip external rotation    Knee flexion 5 5  Knee extension 5 4+  Ankle dorsiflexion 5 1  Ankle plantarflexion    Ankle inversion    Ankle eversion    (Blank rows = not tested)  BED MOBILITY:  Pt reports no issues with bed mobility   TRANSFERS: Assistive device utilized: None  Sit to stand: Modified independence Stand to sit: Modified independence Decr L ankle DF, L ankle held in an inverted position    GAIT: Gait pattern: step through pattern, decreased step length- Right, decreased stance time- Left, decreased hip/knee flexion- Left, decreased ankle dorsiflexion- Left, circumduction- Left, Left hip hike, lateral lean- Left, decreased trunk rotation, and poor foot clearance- Left Distance walked: Clinic distances  Assistive device utilized: None Level of assistance: Modified independence Comments: Pt with L hip hike/circumduction to clear LLE due to L foot held in an inverted position   FUNCTIONAL TESTS:  5 times sit to stand: 16.5 seconds  10 meter walk test: 10.1 seconds = 3.25 ft/sec  TREATMENT DATE:  N/A during eval     PATIENT EDUCATION: Education details: Clinical findings, POC, went over instructions from Dr. Shearon Stalls per appt on 06/22/23: getting scheduled for Botox appt and then following up with PT after botox appt, reaching out to nutritionist for weight loss, and reaching  out to [facility] for a new L AFO (Dr. Shearon Stalls had already put in the order and PT provided phone number and address), getting scheduled for a couple aquatic sessions in the meantime and will be seen by land PT after pt gets Botox to LLE  Person educated: Patient and Spouse Education method: Explanation and Handouts Education comprehension: verbalized understanding  HOME EXERCISE PROGRAM: Will provide at future session.   GOALS: Goals reviewed with patient? Yes  SHORT TERM GOALS: Target date: 11/15/2023  Pt will be independent with aquatic HEP for improved mobility, ROM, and aerobic exercise.  Baseline: Goal status: INITIAL   LONG TERM GOALS: Target date: 12/13/2023  Pt will be independent with land HEP for improved mobility, ROM, and aerobic exercise.  Baseline:  Goal status: INITIAL    ASSESSMENT:  CLINICAL IMPRESSION: Patient is a 23 year old female referred to Neuro OPPT for L hemiplegia due to CP.   Pt's PMH is significant for: Cerebral palsy (HCC), Hydrocephalus (HCC), Left hemiplegia (HCC), and PCOS (polycystic ovarian syndrome). Pt referred here for aquatic therapy and for improving ROM to LLE. Discussed in order to get the max benefit of ROM, then pt should be seen for Botox first and then be seen for land therapy. Pt to be seen for a couple of aquatic appts for HEP (pt has access to a pool). In the meantime, pt to also contact [facility] regarding new L AFO (referral put in by Dr. Shearon Stalls). The following deficits were present during the exam: gait abnormalities, decr strength, decr ROM (L ankle held in inverted position), postural abnormalities.Pt would benefit from skilled PT to address these impairments and functional limitations to maximize functional mobility independence   OBJECTIVE IMPAIRMENTS: Abnormal gait, decreased activity tolerance, decreased balance, decreased coordination, decreased endurance, decreased mobility, difficulty walking, decreased ROM, decreased strength,  hypomobility, impaired flexibility, impaired tone, and postural dysfunction.   ACTIVITY LIMITATIONS: transfers and locomotion level  PARTICIPATION LIMITATIONS: meal prep and community activity  PERSONAL FACTORS: Behavior pattern, Fitness, Past/current experiences, Time since onset of injury/illness/exacerbation, and 3+ comorbidities: Cerebral palsy (HCC), Hydrocephalus (HCC), Left hemiplegia (HCC), and PCOS (polycystic ovarian syndrome).    are also affecting patient's functional outcome.   REHAB POTENTIAL: Good  CLINICAL DECISION MAKING: Stable/uncomplicated  EVALUATION COMPLEXITY: Low  PLAN:  PT FREQUENCY: 1x/week  PT DURATION: 8 weeks  PLANNED INTERVENTIONS: 97164- PT Re-evaluation, 97110-Therapeutic exercises, 97530- Therapeutic activity, 97112- Neuromuscular re-education, 97535- Self Care, 16109- Manual therapy, [phone]- Gait training, [phone]- Orthotic Fit/training, Patient/Family education, and Balance training  PLAN FOR NEXT SESSION: Aquatic HEP for mobility/general strengthening  Get land HEP for general stretching/after botox    Drake Leach, PT, DPT 10/18/2023, 12:22 PM

## 2023-10-21 ENCOUNTER — Telehealth: Payer: Self-pay | Admitting: *Deleted

## 2023-10-21 NOTE — Telephone Encounter (Signed)
Jasmin Reynolds called and states that she forgot and did not get the rx for the brace to [facility] and now they say they need a new Rx faxed to [phone].

## 2023-10-24 NOTE — Telephone Encounter (Signed)
Script written and given to April to fax!

## 2023-10-27 ENCOUNTER — Ambulatory Visit: Payer: Medicare Other | Attending: Physical Medicine and Rehabilitation | Admitting: Physical Therapy

## 2023-11-10 ENCOUNTER — Ambulatory Visit: Payer: Medicare Other | Admitting: Physical Therapy

## 2023-11-14 ENCOUNTER — Encounter
Payer: Medicare Other | Attending: Physical Medicine and Rehabilitation | Admitting: Physical Medicine and Rehabilitation

## 2023-11-14 VITALS — BP 139/96 | HR 110 | Ht 61.0 in | Wt 190.0 lb

## 2023-11-14 DIAGNOSIS — R269 Unspecified abnormalities of gait and mobility: Secondary | ICD-10-CM | POA: Insufficient documentation

## 2023-11-14 DIAGNOSIS — G802 Spastic hemiplegic cerebral palsy: Secondary | ICD-10-CM | POA: Diagnosis present

## 2023-11-14 DIAGNOSIS — G8194 Hemiplegia, unspecified affecting left nondominant side: Secondary | ICD-10-CM | POA: Insufficient documentation

## 2023-11-14 NOTE — Progress Notes (Signed)
 Subjective:    Patient ID: Jasmin Reynolds, female    DOB: 01-Jul-2001, 23 y.o.   MRN: [id number]  HPI   Jasmin Reynolds is a 23 y.o. year old female  who  has a past medical history of Cerebral palsy (HCC), Hydrocephalus (HCC), Left hemiplegia (HCC), and PCOS (polycystic ovarian syndrome).   They are presenting to PM&R clinic for follow up related to obesity and mobility deficits due to L spastic hemiplegic CP  .  Plan from last visit:  Left hemiplegia (HCC) Spastic hemiplegic cerebral palsy (HCC) -     Ambulatory referral to Physical Therapy I have given you prescription for a new left AFO to bring to [facility].  Please bring your current AFO with you to the first appointment, since insurance may not approve a new device within 5 years if this one is still usable, so they can see if it needs adjusted or completely redone.   Return to me in the next month for Botox for the left leg, to work on improving range of motion in her ankle and hopefully helping with gait.    I am sending a referral for aqua therapy today; please let them know when the Botox appointment is so we can schedule therapies within 2 weeks of that.   See me for general follow-up in 3 months.   Obesity, morbid (HCC) -     Amb ref to Medical Nutrition Therapy-MNT -     Ambulatory referral to Physical Therapy   Physical therapy referral as above.   Provided handouts on nutrition for weight loss today, and provided a referral to medical nutrition and weight loss clinic in [place].  Should be getting a call from them about scheduling an appointment.   I recommend starting a calorie tracker such as my fitness pal to keep track of daily food intakes.   Gait difficulty -     Ambulatory referral to Physical Therapy   Keep active is much as possible, attend physical therapy as above, and hopefully between this and Botox, along with remolding the AFO, you should feel more stable while walking and be able to walk  for longer periods more comfortably.   Interval Hx:  - Therapies: Had initial PT evaluation 1/28; has not returned since.    - Follow ups: none; has not followed up with weight loss apps recommended last time.    - Falls: none   - DME: Lost prescription for her brace last visit. Has not been using her old brace.    - Medications: no changes   - Other concerns: Mom lost her job since last appointment, resulted in some delay in therapies/orders form last time.    Pain Inventory Average Pain 0 Pain Right Now 0 My pain is  .  In the last 24 hours, has pain interfered with the following? General activity 0 Relation with others 0 Enjoyment of life 0 What TIME of day is your pain at its worst? . Sleep (in general) Good  Pain is worse with:  . Pain improves with:  . Relief from Meds:  .  No family history on file. Social History   Socioeconomic History   Marital status: Single    Spouse name: Not on file   Number of children: Not on file   Years of education: Not on file   Highest education level: Not on file  Occupational History   Not on file  Tobacco Use   Smoking status: Never  Smokeless tobacco: Never  Vaping Use   Vaping status: Never Used  Substance and Sexual Activity   Alcohol use: Never   Drug use: Never   Sexual activity: Not on file  Other Topics Concern   Not on file  Social History Narrative   Not on file   Social Drivers of Health   Financial Resource Strain: Not on file  Food Insecurity: Not on file  Transportation Needs: Not on file  Physical Activity: Not on file  Stress: Not on file  Social Connections: Not on file   No past surgical history on file. No past surgical history on file. Past Medical History:  Diagnosis Date   Cerebral palsy (HCC)    Hydrocephalus (HCC)    Left hemiplegia (HCC)    PCOS (polycystic ovarian syndrome)    BP (!) 139/96   Pulse (!) 110   Ht 5\' 1"  (1.549 m)   Wt 190 lb (86.2 kg)   SpO2 96%   BMI 35.90  kg/m   Opioid Risk Score:   Fall Risk Score:  `1  Depression screen [facility] 2/9     06/22/2023    9:53 AM 01/06/2023   11:11 AM  Depression screen PHQ 2/9  Decreased Interest 0 0  Down, Depressed, Hopeless 0 1  PHQ - 2 Score 0 1  Altered sleeping 0 1  Tired, decreased energy 1 2  Change in appetite 2 2  Feeling bad or failure about yourself  0 0  Trouble concentrating 0 0  Moving slowly or fidgety/restless 0 0  Suicidal thoughts 0 0  PHQ-9 Score 3 6  Difficult doing work/chores Somewhat difficult Not difficult at all    Review of Systems     Objective:   Physical Exam  PE: Constitution: Appropriate appearance for age. No apparent distress  +Obese Resp: No respiratory distress. No accessory muscle usage. on RA and CTAB Cardio: Well perfused appearance. No peripheral edema. Abdomen: Nondistended. Nontender.   Psych: Appropriate mood and affect. Mild tearfulness discussing weight and medical issues.  Neuro: AAOx4. Mild apparent cognitive deficits; language and comprehension WNL.    Neurologic Exam:   Babinsky: flexor responses b/l.   Hoffmans: negative b/l Sensory exam: revealed normal sensation in all dermatomal regions in bilateral upper extremities and bilateral lower extremities Motor exam: strength 5-/5 throughout bilateral upper extremities and right lower extremity; LLE 4/5 HF, KE, DF and PF Coordination: Fine motor coordination was reduced in bilateral lower extremities  Gait: Scissoriung gait with flexor synergy in LUE; long strides with L foot, mild foot drop   Tone: L MAS 1 finger flexors L Mas 3 foot inverters, MAS 4 ankle DF -5 Rom; Mas 2 knee flexor   No changes since last visit     Assessment & Plan:   Jasmin Reynolds is a 23 y.o. year old female  who  has a past medical history of Cerebral palsy (HCC), Hydrocephalus (HCC), Left hemiplegia (HCC), and PCOS (polycystic ovarian syndrome).   They are presenting to PM&R clinic for follow up related to  obesity and mobility deficits due to L spastic hemiplegic CP  .  Left hemiplegia (HCC) Spastic hemiplegic cerebral palsy (HCC)  Keep active is much as possible, attend physical therapy   We will hold off on scheduling botox until you have your new AFO and are in with PT; follow up with me in 2 months to assess your progress   Gait difficulty I have given you prescription for a new left AFO  to bring to [facility] [facility]. Please bring your current AFO with you to the first appointment, since insurance may not approve a new device within 5 years if this one is still usable, so they can see if it needs adjusted or completely redone.  Obesity, morbid (HCC) -     Amb ref to Medical Nutrition Therapy-MNT Provided a referral to medical nutrition and weight loss clinic in [place].  I recommend starting a calorie tracker such as MyFitnessPal or Cronometer to keep track of daily food intakes.

## 2023-11-14 NOTE — Patient Instructions (Addendum)
  I have given you prescription for a new left AFO to bring to [place] clinic. Please bring your current AFO with you to the first appointment, since insurance may not approve a new device within 5 years if this one is still usable, so they can see if it needs adjusted or completely redone.  Provided a referral to medical nutrition and weight loss clinic in [place].  I recommend starting a calorie tracker such as MyFitnessPal or Cronometer to keep track of daily food intakes.  Keep active is much as possible, attend physical therapy   We will hold off on scheduling botox until you have your new AFO and are in with PT; follow up with me in 2 months to assess your progress

## 2023-11-24 ENCOUNTER — Ambulatory Visit: Payer: Medicare Other | Attending: Physical Medicine and Rehabilitation | Admitting: Physical Therapy

## 2023-11-24 ENCOUNTER — Encounter: Payer: Self-pay | Admitting: Physical Therapy

## 2023-11-24 VITALS — BP 127/90 | HR 102

## 2023-11-24 DIAGNOSIS — M25672 Stiffness of left ankle, not elsewhere classified: Secondary | ICD-10-CM | POA: Diagnosis present

## 2023-11-24 DIAGNOSIS — R2689 Other abnormalities of gait and mobility: Secondary | ICD-10-CM | POA: Insufficient documentation

## 2023-11-24 DIAGNOSIS — M6281 Muscle weakness (generalized): Secondary | ICD-10-CM | POA: Diagnosis present

## 2023-11-24 NOTE — Therapy (Signed)
 OUTPATIENT PHYSICAL THERAPY NEURO TREATMENT/RE-CERT   Patient Name: Jasmin Reynolds MRN: [id number] DOB:04/27/2001, 23 y.o., female Today's Date: 11/24/2023   PCP: Alfredia Ferguson, PA-C  REFERRING PROVIDER: Angelina Sheriff, DO  END OF SESSION:  PT End of Session - 11/24/23 1102     Visit Number 2    Number of Visits 7    Date for PT Re-Evaluation 01/23/24   per re-cert on 4/0/98   Authorization Type MEDICARE PART A AND B    PT Start Time 1100    PT Stop Time 1141    PT Time Calculation (min) 41 min    Activity Tolerance Patient tolerated treatment well    Behavior During Therapy WFL for tasks assessed/performed             Past Medical History:  Diagnosis Date   Cerebral palsy (HCC)    Hydrocephalus (HCC)    Left hemiplegia (HCC)    PCOS (polycystic ovarian syndrome)    History reviewed. No pertinent surgical history. Patient Active Problem List   Diagnosis Date Noted   Obesity, morbid (HCC) 06/22/2023   Gait difficulty 06/22/2023   Prediabetes 01/06/2023   Hydrocephalus (HCC) 05/04/2019   Cerebral palsy (HCC) 05/04/2019   Left hemiplegia (HCC) 05/04/2019   Legally blind 05/04/2019   PCOS (polycystic ovarian syndrome) 05/04/2019    ONSET DATE: 06/22/2023  REFERRING DIAG: G81.94 (ICD-10-CM) - Left hemiplegia (HCC) G80.2 (ICD-10-CM) - Spastic hemiplegic cerebral palsy (HCC) E66.01 (ICD-10-CM) - Obesity, morbid (HCC) R26.9 (ICD-10-CM) - Gait difficulty  THERAPY DIAG:  Other abnormalities of gait and mobility  Muscle weakness (generalized)  Stiffness of left ankle, not elsewhere classified  Rationale for Evaluation and Treatment: Rehabilitation  SUBJECTIVE:                                                                                                                                                                                             SUBJECTIVE STATEMENT:   GOES BY JESSIE   Pt's mom got laid off since pt was here for her eval. Missed both  water appts due to the snow and one day she was sick. Wants to make up these pool appts that she missed. Saw Dr. Shearon Stalls and she gave a new referral for the L AFO and that is in [place]. Have not heard from them yet. Dr. Shearon Stalls wants pt to get L AFO first before doing Botox. Pt's mom reports that pt will have access to a pool for a few times a week starting in May. Pt reports sometimes having some pain in dorsum part of hand, but notes that it has gotten better.  Pt accompanied by: self and Mom  PERTINENT HISTORY: PMH: Cerebral palsy (HCC), Hydrocephalus (HCC), Left hemiplegia (HCC), and PCOS (polycystic ovarian syndrome).    PAIN:  Are you having pain? No  PRECAUTIONS: None   FALLS: Has patient fallen in last 6 months? 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 , and Leisure: likes to make little books, watch TV, playing games   Per mom, gets in the shower on her own and helps with bathing, needs assistance getting out.  Parents do cooking   PATIENT GOALS: Per mom, not to get as winded and tired when walking places.   OBJECTIVE:  Note: Objective measures were completed at Evaluation unless otherwise noted. COGNITION: Overall cognitive status: Within functional limits for tasks assessed   SENSATION: Light touch: WFL  COORDINATION: Heel to shin: slower to perform with LLE    MUSCLE TONE: LLE: Hypertonic   POSTURE: rounded shoulders, forward head, and increased thoracic kyphosis  LOWER EXTREMITY ROM:     Passive  Right Eval Left Eval  Hip flexion WNL WNL  Hip extension    Hip abduction    Hip adduction    Hip internal rotation WNL WNL   Hip external rotation WNL WNL  Knee flexion    Knee extension    Ankle dorsiflexion  Significantly hypomobile, unable to reach neutral passively  Ankle plantarflexion    Ankle inversion  Pt's L foot held in a contracted inversion position  Ankle eversion  Significantly hypomobile   (Blank rows = not tested)  LOWER EXTREMITY MMT:    MMT Right Eval Left Eval  Hip flexion 5 4  Hip extension    Hip abduction 4+ 4  Hip adduction    Hip internal rotation    Hip external rotation    Knee flexion 5 5  Knee extension 5 4+  Ankle dorsiflexion 5 1  Ankle plantarflexion    Ankle inversion    Ankle eversion    (Blank rows = not tested)  BED MOBILITY:  Pt reports no issues with bed mobility   TRANSFERS: Assistive device utilized: None  Sit to stand: Modified independence Stand to sit: Modified independence Decr L ankle DF, L ankle held in an inverted position    GAIT: Gait pattern: step through pattern, decreased step length- Right, decreased stance time- Left, decreased hip/knee flexion- Left, decreased ankle dorsiflexion- Left, circumduction- Left, Left hip hike, lateral lean- Left, decreased trunk rotation, and poor foot clearance- Left Distance walked: Clinic distances  Assistive device utilized: None Level of assistance: Modified independence Comments: Pt with L hip hike/circumduction to clear LLE due to L foot held in an inverted position   FUNCTIONAL TESTS:  5 times sit to stand: 16.5 seconds  10 meter walk test: 10.1 seconds = 3.25 ft/sec  TREATMENT DATE:   Therapeutic Activity:  Vitals:   11/24/23 1114  BP: (!) 127/90  Pulse: (!) 102   Discussed POC going forwards - pt had to miss aquatic appts due to illness and snow (pt's mom reports they called to cancel), will re-schedule missed 2 appts for pool therapy. Will wait to continue land  therapy until pt gets L AFO and Botox   Provided phone number for [facility] in [place] to start process of pt obtaining L AFO   Therapeutic Exercise:  Long siting hamstring stretch, pt unable to reach forwards to perform stretch, so discontinued this  Long sitting ankle DF stretch with strap, pt not feeling a stretch and unable to perform with sustained pressure and L foot more in an inverted position, so discontinued this exercise   Access Code: [email or web address] URL: [email or web address] Date: 11/24/2023 Prepared by: Sherlie Ban  Initiated HEP for functional strength and ROM, see [facility] for more details   Exercises - Sit to Stand  - 1 x daily - 7 x weekly - 2 sets - 10 reps - focus on putting weight through LLE  - Gastroc Stretch on Wall  - 2 x daily - 7 x weekly - 3 sets - 30-45 hold - Standing Bilateral Gastroc Stretch with Step  - 2 x daily - 7 x weekly - 3 sets - 30-45 hold - Supine Bridge  - 1 x daily - 7 x weekly - 2 sets - 10 reps - Standing Marching  - 1 x daily - 7 x weekly - 2 sets - 10 reps  PATIENT EDUCATION: Education details: Re-scheduled 2 pool appts that pt had to miss due to weather and sickness, initial land HEP Person educated: Patient and Spouse Education method: Explanation and Handouts Education comprehension: verbalized understanding  HOME EXERCISE PROGRAM: Access Code: [email or web address] URL: [email or web address] Date: 11/24/2023 Prepared by: Sherlie Ban  Exercises - Sit to Stand  - 1 x daily - 7 x weekly - 2 sets - 10 reps - focus on putting weight through LLE  - Gastroc Stretch on Wall  - 2 x daily - 7 x weekly - 3 sets - 30-45 hold - Standing Bilateral Gastroc Stretch with Step  - 2 x daily - 7 x weekly - 3 sets - 30-45 hold - Supine Bridge  - 1 x daily - 7 x weekly - 2 sets - 10 reps - Standing Marching  - 1 x daily - 7 x weekly - 2 sets - 10 reps  GOALS: Goals reviewed with patient? Yes  SHORT TERM GOALS: Target  date: 11/15/2023  Pt will be independent with aquatic HEP for improved mobility, ROM, and aerobic exercise.  Baseline: pt has not had aquatic therapy yet  Goal status: NOT MET   LONG TERM GOALS: Target date: 12/13/2023 UPDATED GOAL DATE FOR RE-CERT: 04/21/9561  Pt will be independent with land and aquatic HEP for improved mobility, ROM, and aerobic exercise.  Baseline:  Goal status: INITIAL    ASSESSMENT:  CLINICAL IMPRESSION: Pt returns to PT since eval on 10/18/23. Pt had to cancel PT appts for aquatic due to weather and pt being sick. Re-scheduled these for pt to get aquatic HEP for strength/mobility (pt will have access to a pool starting in May). Today's skilled session focused on initiating land HEP for mobility/strength. Will wait to add more land appts until pt gets her L AFO and receives botox. Will continue per POC.    OBJECTIVE IMPAIRMENTS: Abnormal gait, decreased activity tolerance,  decreased balance, decreased coordination, decreased endurance, decreased mobility, difficulty walking, decreased ROM, decreased strength, hypomobility, impaired flexibility, impaired tone, and postural dysfunction.   ACTIVITY LIMITATIONS: transfers and locomotion level  PARTICIPATION LIMITATIONS: meal prep and community activity  PERSONAL FACTORS: Behavior pattern, Fitness, Past/current experiences, Time since onset of injury/illness/exacerbation, and 3+ comorbidities: Cerebral palsy (HCC), Hydrocephalus (HCC), Left hemiplegia (HCC), and PCOS (polycystic ovarian syndrome).    are also affecting patient's functional outcome.   REHAB POTENTIAL: Good  CLINICAL DECISION MAKING: Stable/uncomplicated  EVALUATION COMPLEXITY: Low  PLAN:  PT FREQUENCY: 1x/week  PT DURATION: 8 weeks  PLANNED INTERVENTIONS: 97164- PT Re-evaluation, 97110-Therapeutic exercises, 97530- Therapeutic activity, 97112- Neuromuscular re-education, 97535- Self Care, 24401- Manual therapy, [phone]- Gait training, [phone]-  Orthotic Fit/training, Patient/Family education, and Balance training  PLAN FOR NEXT SESSION: Aquatic HEP for mobility/general strengthening  Wait to schedule a couple other land visits after pt gets L AFO and botox    Drake Leach, PT, DPT 11/24/2023, 12:48 PM

## 2023-12-08 ENCOUNTER — Ambulatory Visit: Payer: Self-pay | Admitting: Physical Therapy

## 2023-12-08 ENCOUNTER — Ambulatory Visit: Payer: Medicare Other | Admitting: Physical Therapy

## 2023-12-08 ENCOUNTER — Encounter: Payer: Self-pay | Admitting: Physical Therapy

## 2023-12-08 DIAGNOSIS — M6281 Muscle weakness (generalized): Secondary | ICD-10-CM

## 2023-12-08 DIAGNOSIS — M25672 Stiffness of left ankle, not elsewhere classified: Secondary | ICD-10-CM

## 2023-12-08 DIAGNOSIS — R2689 Other abnormalities of gait and mobility: Secondary | ICD-10-CM | POA: Diagnosis not present

## 2023-12-08 NOTE — Therapy (Signed)
 OUTPATIENT PHYSICAL THERAPY NEURO TREATMENT/AQUATIC NOTE   Patient Name: Jasmin Reynolds MRN: [id number] DOB:03/12/2001, 23 y.o., female Today's Date: 12/08/2023   PCP: Alfredia Ferguson, PA-C  REFERRING PROVIDER: Angelina Sheriff, DO  END OF SESSION:  PT End of Session - 12/08/23 1539     Visit Number 3    Number of Visits 7    Date for PT Re-Evaluation 01/23/24   per re-cert on 12/25/94   Authorization Type MEDICARE PART A AND B    PT Start Time 1101    PT Stop Time 1145    PT Time Calculation (min) 44 min    Equipment Utilized During Treatment Other (comment)   floatation devices as needed for support/resistance   Activity Tolerance Patient tolerated treatment well    Behavior During Therapy WFL for tasks assessed/performed             Past Medical History:  Diagnosis Date   Cerebral palsy (HCC)    Hydrocephalus (HCC)    Left hemiplegia (HCC)    PCOS (polycystic ovarian syndrome)    History reviewed. No pertinent surgical history. Patient Active Problem List   Diagnosis Date Noted   Obesity, morbid (HCC) 06/22/2023   Gait difficulty 06/22/2023   Prediabetes 01/06/2023   Hydrocephalus (HCC) 05/04/2019   Cerebral palsy (HCC) 05/04/2019   Left hemiplegia (HCC) 05/04/2019   Legally blind 05/04/2019   PCOS (polycystic ovarian syndrome) 05/04/2019    ONSET DATE: 06/22/2023  REFERRING DIAG: G81.94 (ICD-10-CM) - Left hemiplegia (HCC) G80.2 (ICD-10-CM) - Spastic hemiplegic cerebral palsy (HCC) E66.01 (ICD-10-CM) - Obesity, morbid (HCC) R26.9 (ICD-10-CM) - Gait difficulty  THERAPY DIAG:  Other abnormalities of gait and mobility  Muscle weakness (generalized)  Stiffness of left ankle, not elsewhere classified  Rationale for Evaluation and Treatment: Rehabilitation  SUBJECTIVE:                                                                                                                                                                                              SUBJECTIVE STATEMENT:   GOES BY Jasmin Reynolds   Pt presents with mom and step-dad reporting she is ready to get in the water.  Step-dad informs PT she was on swim team and really enjoys the pool.  Mom reports it would be good to get her things to do in the pool when it opens in the summer and Brayton Caves agrees.  Her [facility] appt for AFO is within the next week.  Pt accompanied by: self and Mom  PERTINENT HISTORY: PMH: Cerebral palsy (HCC), Hydrocephalus (HCC), Left hemiplegia (HCC), and PCOS (polycystic ovarian syndrome).    PAIN:  Are you having pain? No  PRECAUTIONS: None   FALLS: Has patient fallen in last 6 months? 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 , and Leisure: likes to make little books, watch TV, playing games   Per mom, gets in the shower on her own and helps with bathing, needs assistance getting out.  Parents do cooking   PATIENT GOALS: Per mom, not to get as winded and tired when walking places.   OBJECTIVE:  Note: Objective measures were completed at Evaluation unless otherwise noted. COGNITION: Overall cognitive status: Within functional limits for tasks assessed   SENSATION: Light touch: WFL  COORDINATION: Heel to shin: slower to perform with LLE    MUSCLE TONE: LLE: Hypertonic   POSTURE: rounded shoulders, forward head, and increased thoracic kyphosis  LOWER EXTREMITY ROM:     Passive  Right Eval Left Eval  Hip flexion WNL WNL  Hip extension    Hip abduction    Hip adduction    Hip internal rotation WNL WNL  Hip external rotation WNL WNL  Knee flexion    Knee extension    Ankle dorsiflexion  Significantly hypomobile,  unable to reach neutral passively  Ankle plantarflexion    Ankle inversion  Pt's L foot held in a contracted inversion position  Ankle eversion  Significantly hypomobile   (Blank rows = not tested)  LOWER EXTREMITY MMT:    MMT Right Eval Left Eval  Hip flexion 5 4  Hip extension    Hip abduction 4+ 4  Hip adduction    Hip internal rotation    Hip external rotation    Knee flexion 5 5  Knee extension 5 4+  Ankle dorsiflexion 5 1  Ankle plantarflexion    Ankle inversion    Ankle eversion    (Blank rows = not tested)  BED MOBILITY:  Pt reports no issues with bed mobility   TRANSFERS: Assistive device utilized: None  Sit to stand: Modified independence Stand to sit: Modified independence Decr L ankle DF, L ankle held in an inverted position    GAIT: Gait pattern: step through pattern, decreased step length- Right, decreased stance time- Left, decreased hip/knee flexion- Left, decreased ankle dorsiflexion- Left, circumduction- Left, Left hip hike, lateral lean- Left, decreased trunk rotation, and poor foot clearance- Left Distance walked: Clinic distances  Assistive device utilized: None Level of assistance: Modified independence Comments: Pt with L hip hike/circumduction to clear LLE due to L foot held in an inverted position   FUNCTIONAL TESTS:  5 times sit to stand: 16.5 seconds  10 meter walk test: 10.1 seconds = 3.25 ft/sec  TREATMENT DATE:   Aquatic therapy at [facility] - pool temperature 92 degrees   Patient seen for aquatic therapy today.  Treatment took place in water 3.6-4.8 feet deep depending upon activity.  Patient entered and exited the pool via stairs using bilateral rails and step to pattern at SBA level.   Exercises: Water walking warmup:  forward > backwards > laterally 6x18 ft each unsupported -Standing march x2 minutes >  downward low resistance dumbbell press with hold 2x20 -Supported squats 2x10 -6" step ups alternating LE leading with BUE support 2x20 -Heel raises x20 > toe raises w/ more difficulty coordinating movement so regressed to sitting then ankle pumps x2 minutes -STS 2x10 -Standing hip 3-way w/ return demo 2x10 each direction w/ unilateral pool wall support -Standing unsupported using low resistance multi-colored dumbbell for shoulder flexion 2x20 > abduction/adduction 2x20 > chest press w/ scapular retraction x10 regressed to hand resistance x20 > forward shoulder circles x20 > backwards shoulder circles x20  Patient requires buoyancy of the water for support for reduced fall risk with gait training and balance exercises with SBA support. Exercises able to be performed safely in water without the risk of fall compared to those same exercises performed on land; viscosity of water needed for resistance for strengthening. Current of water provides perturbations for challenging static and dynamic balance.    PATIENT EDUCATION: Education details: Next pool appt and contacting clinic when AFO received for further land visits. Person educated: Patient and Spouse Education method: Explanation and Handouts Education comprehension: verbalized understanding  HOME EXERCISE PROGRAM: Access Code: [email or web address] URL: [email or web address] Date: 11/24/2023 Prepared by: Sherlie Ban  Exercises - Sit to Stand  - 1 x daily - 7 x weekly - 2 sets - 10 reps - focus on putting weight through LLE  - Gastroc Stretch on Wall  - 2 x daily - 7 x weekly - 3 sets - 30-45 hold - Standing Bilateral Gastroc Stretch with Step  - 2 x daily - 7 x weekly - 3 sets - 30-45 hold - Supine Bridge  - 1 x daily - 7 x weekly - 2 sets - 10 reps - Standing Marching  - 1 x daily - 7 x weekly - 2 sets - 10 reps  GOALS: Goals reviewed with patient? Yes  SHORT TERM GOALS: Target date: 11/15/2023  Pt will be independent with  aquatic HEP for improved mobility, ROM, and aerobic exercise.  Baseline: pt has not had aquatic therapy yet  Goal status: NOT MET   LONG TERM GOALS: Target date: 12/13/2023 UPDATED GOAL DATE FOR RE-CERT: 09/25/1094  Pt will be independent with land and aquatic HEP for improved mobility, ROM, and aerobic exercise.  Baseline:  Goal status: INITIAL    ASSESSMENT:  CLINICAL IMPRESSION: Initiated aquatic therapy this visit with pt tolerating all interventions very well.  She is well accustomed to this environment from prior experiences and would like an aquatic HEP for the summer.  PT will focus next aquatic session on establishing this for her.  Continue with POC to address general mobility/strengthening and activity tolerance.   OBJECTIVE IMPAIRMENTS: Abnormal gait, decreased activity tolerance, decreased balance, decreased coordination, decreased endurance, decreased mobility, difficulty walking, decreased ROM, decreased strength, hypomobility, impaired flexibility, impaired tone, and postural dysfunction.   ACTIVITY LIMITATIONS: transfers and locomotion level  PARTICIPATION LIMITATIONS: meal prep and community activity  PERSONAL FACTORS: Behavior pattern, Fitness, Past/current experiences, Time since onset of injury/illness/exacerbation, and 3+ comorbidities: Cerebral palsy (HCC), Hydrocephalus (HCC), Left hemiplegia (HCC), and  PCOS (polycystic ovarian syndrome).    are also affecting patient's functional outcome.   REHAB POTENTIAL: Good  CLINICAL DECISION MAKING: Stable/uncomplicated  EVALUATION COMPLEXITY: Low  PLAN:  PT FREQUENCY: 1x/week  PT DURATION: 8 weeks  PLANNED INTERVENTIONS: 97164- PT Re-evaluation, 97110-Therapeutic exercises, 97530- Therapeutic activity, 97112- Neuromuscular re-education, 97535- Self Care, 09811- Manual therapy, [phone]- Gait training, [phone]- Orthotic Fit/training, Patient/Family education, and Balance training  PLAN FOR NEXT SESSION: Aquatic HEP for  mobility/general strengthening  Wait to schedule a couple other land visits after pt gets L AFO and botox    Sadie Haber, PT, DPT 12/08/2023, 3:40 PM

## 2023-12-20 ENCOUNTER — Encounter: Payer: Self-pay | Admitting: Family Medicine

## 2023-12-20 ENCOUNTER — Ambulatory Visit (INDEPENDENT_AMBULATORY_CARE_PROVIDER_SITE_OTHER): Admitting: Family Medicine

## 2023-12-20 VITALS — BP 127/90 | HR 118 | Ht 60.0 in | Wt 194.4 lb

## 2023-12-20 DIAGNOSIS — G809 Cerebral palsy, unspecified: Secondary | ICD-10-CM

## 2023-12-20 DIAGNOSIS — I10 Essential (primary) hypertension: Secondary | ICD-10-CM

## 2023-12-20 MED ORDER — HYDROCHLOROTHIAZIDE 12.5 MG PO TABS: 12.5000 mg | ORAL_TABLET | Freq: Every day | ORAL | 1 refills | Status: DC

## 2023-12-20 NOTE — Progress Notes (Signed)
 Acute visit   Patient: Jasmin Reynolds   DOB: [birth year]   23 y.o. Female  MRN: [id number] PCP: Ronnald Ramp, MD   Chief Complaint  Patient presents with   Hypertension    Would like to see options to make sure it is staying under control   Subjective    Discussed the use of AI scribe software for clinical note transcription with the patient, who gave verbal consent to proceed.  History of Present Illness   The patient, with a history of cerebral palsy, presents with high blood pressure. The patient has been attending physical therapy to improve physical activity levels, but the caregiver reports that the patient's speed is limited due to the cerebral palsy. The patient's blood pressure has been consistently high at physical therapy and previous medical appointments. The patient has a family history of stroke and bypass surgery, which increases her risk for cardiovascular disease. The patient has also been taking a vaginal probiotic due to a fishy smell after swimming.        Review of Systems  Objective    BP (!) 127/90 Comment: home readings  Pulse (!) 118   Ht 5' (1.524 m)   Wt 194 lb 6.4 oz (88.2 kg)   SpO2 97%   BMI 37.97 kg/m  Physical Exam Vitals reviewed.  Constitutional:      General: She is not in acute distress.    Appearance: Normal appearance. She is well-developed. She is not diaphoretic.  HENT:     Head: Normocephalic and atraumatic.  Eyes:     General: No scleral icterus.    Conjunctiva/sclera: Conjunctivae normal.  Neck:     Thyroid: No thyromegaly.  Cardiovascular:     Rate and Rhythm: Normal rate and regular rhythm.     Heart sounds: Normal heart sounds. No murmur heard. Pulmonary:     Effort: Pulmonary effort is normal. No respiratory distress.     Breath sounds: Normal breath sounds. No wheezing, rhonchi or rales.  Musculoskeletal:     Cervical back: Neck supple.     Right lower leg: No edema.     Left lower leg: No edema.   Lymphadenopathy:     Cervical: No cervical adenopathy.  Skin:    General: Skin is warm and dry.  Neurological:     Mental Status: She is alert.       No results found for any visits on 12/20/23.  Assessment & Plan     Problem List Items Addressed This Visit       Nervous and Auditory   Cerebral palsy (HCC)   Other Visit Diagnoses       Primary hypertension    -  Primary   Relevant Medications   hydrochlorothiazide (HYDRODIURIL) 12.5 MG tablet           Hypertension Consistently elevated blood pressure readings confirmed by previous visits. Current level poses long-term risks to heart and kidneys if untreated. Family history of cardiovascular issues, including stroke and coronary artery disease, increases her risk. Working on lifestyle modifications, including physical therapy and an upcoming nutritionist appointment. Medication recommended as a short-term measure to manage blood pressure while these changes are implemented. Discussed two medication options: amlodipine and hydrochlorothiazide. Amlodipine is well-tolerated but may cause leg swelling in 5% of patients. Hydrochlorothiazide, a diuretic, may cause increased urination but is preferred by her to avoid leg swelling. Initiating hydrochlorothiazide 12.5 mg daily with follow-up in 4-6 weeks to assess blood pressure and  adjust dosage if necessary. - Initiate hydrochlorothiazide 12.5 mg daily. - Follow-up in 4-6 weeks to assess blood pressure and adjust dosage if necessary. - Provide a low sodium diet handout and advise on reducing sodium intake from preserved foods, canned and frozen foods, and soda. - Plan for follow-up blood tests to check kidney function and electrolytes after starting medication at 4-6 week f/u appt.  Cerebral Palsy Cerebral palsy affects her left side, making exercise challenging. Currently undergoing physical therapy to improve physical activity levels. - Continue physical therapy to improve  exercise capacity.  Vaginal Health Experienced occasional fishy odor, possibly indicating bacterial vaginosis or imbalance in vaginal flora. Using over-the-counter vaginal probiotics, which have alleviated symptoms. - Continue using over-the-counter vaginal probiotics as needed.         Meds ordered this encounter  Medications   hydrochlorothiazide (HYDRODIURIL) 12.5 MG tablet    Sig: Take 1 tablet (12.5 mg total) by mouth daily.    Dispense:  30 tablet    Refill:  1     Return in about 6 weeks (around 01/31/2024) for BP f/u, With PCP.      Shirlee Latch, MD  [facility] [phone] (phone) [phone] (fax)  [facility]

## 2023-12-22 ENCOUNTER — Ambulatory Visit: Payer: Self-pay | Attending: Physical Medicine and Rehabilitation | Admitting: Physical Therapy

## 2023-12-22 DIAGNOSIS — M25672 Stiffness of left ankle, not elsewhere classified: Secondary | ICD-10-CM | POA: Insufficient documentation

## 2023-12-22 DIAGNOSIS — R2689 Other abnormalities of gait and mobility: Secondary | ICD-10-CM | POA: Diagnosis present

## 2023-12-22 DIAGNOSIS — M6281 Muscle weakness (generalized): Secondary | ICD-10-CM | POA: Diagnosis present

## 2023-12-23 ENCOUNTER — Encounter: Payer: Self-pay | Admitting: Physical Therapy

## 2023-12-23 NOTE — Therapy (Unsigned)
 OUTPATIENT PHYSICAL THERAPY NEURO TREATMENT/AQUATIC NOTE/RE-CERT   Patient Name: Jasmin Reynolds MRN: [id number] DOB:Dec 17, 2000, 23 y.o., female Today's Date: 12/23/2023   PCP: Alfredia Ferguson, PA-C  REFERRING PROVIDER: Angelina Sheriff, DO  END OF SESSION:   12/22/23 1102  PT Visits / Re-Eval  Visit Number 4  Number of Visits 15 (7 + 8)  Date for PT Re-Evaluation 02/17/24 (per re-cert on 4/0/98)  Authorization  Authorization Type MEDICARE PART A AND B  Progress Note Due on Visit 10  PT Time Calculation  PT Start Time 1103  PT Stop Time 1145  PT Time Calculation (min) 42 min  PT - End of Session  Equipment Utilized During Treatment Other (comment) (floatation devices as needed for support/resistance)  Activity Tolerance Patient tolerated treatment well  Behavior During Therapy WFL for tasks assessed/performed    Past Medical History:  Diagnosis Date   Cerebral palsy (HCC)    Hydrocephalus (HCC)    Left hemiplegia (HCC)    PCOS (polycystic ovarian syndrome)    History reviewed. No pertinent surgical history. Patient Active Problem List   Diagnosis Date Noted   Obesity, morbid (HCC) 06/22/2023   Gait difficulty 06/22/2023   Prediabetes 01/06/2023   Hydrocephalus (HCC) 05/04/2019   Cerebral palsy (HCC) 05/04/2019   Left hemiplegia (HCC) 05/04/2019   Legally blind 05/04/2019   PCOS (polycystic ovarian syndrome) 05/04/2019    ONSET DATE: 06/22/2023  REFERRING DIAG: G81.94 (ICD-10-CM) - Left hemiplegia (HCC) G80.2 (ICD-10-CM) - Spastic hemiplegic cerebral palsy (HCC) E66.01 (ICD-10-CM) - Obesity, morbid (HCC) R26.9 (ICD-10-CM) - Gait difficulty  THERAPY DIAG:  Other abnormalities of gait and mobility  Muscle weakness (generalized)  Stiffness of left ankle, not elsewhere classified  Rationale for Evaluation and Treatment: Rehabilitation  SUBJECTIVE:                                                                                                                                                                                              SUBJECTIVE STATEMENT:   GOES BY JESSIE   Pt presents with step-dad at [facility].  Step-dad informs PT that they really enjoy pool as it is low impact.  They saw the orthotist at [facility] who feels they need botox (scheduled 4/28 w/ Dr. Shearon Stalls) prior to deciding if AFO would be beneficial due to inversion/IR of LE.  They are open to re-certing for more pool visits to establish an HEP as well as a land follow-up for a left foot stretching program following botox as well as further AFO assessment.  Pt accompanied by: self and Mom  PERTINENT HISTORY: PMH: Cerebral palsy (HCC), Hydrocephalus (HCC), Left hemiplegia (  HCC), and PCOS (polycystic ovarian syndrome).    PAIN:  Are you having pain? No  PRECAUTIONS: None   FALLS: Has patient fallen in last 6 months? 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 , and Leisure: likes to make little books, watch TV, playing games   Per mom, gets in the shower on her own and helps with bathing, needs assistance getting out.  Parents do cooking   PATIENT GOALS: Per mom, not to get as winded and tired when walking places.   OBJECTIVE:  Note: Objective measures were completed at Evaluation unless otherwise noted. COGNITION: Overall cognitive status: Within functional limits for tasks assessed   SENSATION: Light touch: WFL  COORDINATION: Heel to shin: slower to perform with LLE    MUSCLE TONE: LLE: Hypertonic   POSTURE: rounded shoulders, forward head, and increased thoracic kyphosis  LOWER EXTREMITY ROM:     Passive  Right Eval Left Eval  Hip  flexion WNL WNL  Hip extension    Hip abduction    Hip adduction    Hip internal rotation WNL WNL  Hip external rotation WNL WNL  Knee flexion    Knee extension    Ankle dorsiflexion  Significantly hypomobile, unable to reach neutral passively  Ankle plantarflexion    Ankle inversion  Pt's L foot held in a contracted inversion position  Ankle eversion  Significantly hypomobile   (Blank rows = not tested)  LOWER EXTREMITY MMT:    MMT Right Eval Left Eval  Hip flexion 5 4  Hip extension    Hip abduction 4+ 4  Hip adduction    Hip internal rotation    Hip external rotation    Knee flexion 5 5  Knee extension 5 4+  Ankle dorsiflexion 5 1  Ankle plantarflexion    Ankle inversion    Ankle eversion    (Blank rows = not tested)  BED MOBILITY:  Pt reports no issues with bed mobility   TRANSFERS: Assistive device utilized: None  Sit to stand: Modified independence Stand to sit: Modified independence Decr L ankle DF, L ankle held in an inverted position    GAIT: Gait pattern: step through pattern, decreased step length- Right, decreased stance time- Left, decreased hip/knee flexion- Left, decreased ankle dorsiflexion- Left, circumduction- Left, Left hip hike, lateral lean- Left, decreased trunk rotation, and poor foot clearance- Left Distance walked: Clinic distances  Assistive device utilized: None Level of assistance: Modified independence Comments: Pt with L hip hike/circumduction to clear LLE due to L foot held in an inverted position   FUNCTIONAL TESTS:  5 times sit to stand: 16.5 seconds  10 meter walk test: 10.1 seconds = 3.25 ft/sec  TREATMENT DATE:   Aquatic therapy at [facility] - pool temperature approximately 92 degrees   Patient seen for aquatic therapy today.  Treatment took place in water 3.6-4.0 feet deep depending upon  activity.  Patient entered and exited the pool via stairs using bilateral rails and step to pattern at SBA level.   Exercises: Water walking warmup:  forward > backwards > laterally 6x18 ft each unsupported -Squats in shallow depth 3x10 w/ wall support -Core using multicolor low resistance dumbbells:  shoulder flexion > abduction > chest press (w/ moderate return demo for form) x20 each -Hamstring stretch on stairs using BUE support 3x45 seconds each LE, pt feels stretch w/ very shallow forward lean, PT adjusts left foot placement to deepen stretch as foot turns inward w/ significant rigidity -Calf stretch on stairs 3x30 seconds each side, unable to adjust left heel to improve stretch so not much increased depth of DF with repetition -Plank kickbacks 2x20 -Straddle sitting on yellow noodle > paddling w/ multicolor low resistance dumbbells and BLE 2x18 ft, increased time to turn w/ few instances of needed righting reaction to maintain upright SBA  Patient requires buoyancy of the water for support for reduced fall risk with gait training and balance exercises with SBA support. Exercises able to be performed safely in water without the risk of fall compared to those same exercises performed on land; viscosity of water needed for resistance for strengthening. Current of water provides perturbations for challenging static and dynamic balance.  PT also working to improve LLE ROM, particularly of the ankle.  PATIENT EDUCATION: Education details: Process for re-cert, goal of aquatic HEP, scheduling follow-up pool visits and at least one land visit following 4/28 botox appt for stretching program and further brace assessment/communication with [facility].  Will call pt to schedule appts as PT did not have access to laptop during appt - dad agrees that this is fine. Person educated: Patient and Spouse Education method: Explanation and Handouts Education comprehension: verbalized understanding  HOME EXERCISE  PROGRAM: Access Code: [email or web address] URL: [email or web address] Date: 11/24/2023 Prepared by: Sherlie Ban  Exercises - Sit to Stand  - 1 x daily - 7 x weekly - 2 sets - 10 reps - focus on putting weight through LLE  - Gastroc Stretch on Wall  - 2 x daily - 7 x weekly - 3 sets - 30-45 hold - Standing Bilateral Gastroc Stretch with Step  - 2 x daily - 7 x weekly - 3 sets - 30-45 hold - Supine Bridge  - 1 x daily - 7 x weekly - 2 sets - 10 reps - Standing Marching  - 1 x daily - 7 x weekly - 2 sets - 10 reps  GOALS: Goals reviewed with patient? Yes  SHORT TERM GOALS: Target date: 11/15/2023  Pt will be independent with aquatic HEP for improved mobility, ROM, and aerobic exercise.  Baseline: pt has not had aquatic therapy yet  Goal status: NOT MET   LONG TERM GOALS: Target date: 12/13/2023 UPDATED GOAL DATE FOR RE-CERT: 09/25/1094  Pt will be independent with land and aquatic HEP for improved mobility, ROM, and aerobic exercise.  Baseline: Not yet established. Goal status: NOT MET  GOALS (at 0/4/54 re-cert): Goals reviewed with patient? Yes  SHORT TERM GOALS: Target date: 01/20/2024  Pt will be independent with aquatic HEP for improved mobility, ROM, and aerobic exercise.  Baseline:  To be established. Goal status: NOT MET   LONG TERM GOALS: Target date: 02/17/2024  Pt will be  independent with land and aquatic HEP for improved mobility, ROM, and aerobic exercise.  Baseline: To be established. Goal status: INITIAL   2.  Pt will have botox in order to benefit from stretching program and possible bracing of the left foot to improve safety and mechanics of gait.   ASSESSMENT:  CLINICAL IMPRESSION: Initiated aquatic therapy this visit with pt tolerating all interventions very well.  She is well accustomed to this environment from prior experiences and would like an aquatic HEP for the summer.  PT will focus next aquatic session on establishing this for her.  Continue  with POC to address general mobility/strengthening and activity tolerance.   OBJECTIVE IMPAIRMENTS: Abnormal gait, decreased activity tolerance, decreased balance, decreased coordination, decreased endurance, decreased mobility, difficulty walking, decreased ROM, decreased strength, hypomobility, impaired flexibility, impaired tone, and postural dysfunction.   ACTIVITY LIMITATIONS: transfers and locomotion level  PARTICIPATION LIMITATIONS: meal prep and community activity  PERSONAL FACTORS: Behavior pattern, Fitness, Past/current experiences, Time since onset of injury/illness/exacerbation, and 3+ comorbidities: Cerebral palsy (HCC), Hydrocephalus (HCC), Left hemiplegia (HCC), and PCOS (polycystic ovarian syndrome).    are also affecting patient's functional outcome.   REHAB POTENTIAL: Good  CLINICAL DECISION MAKING: Stable/uncomplicated  EVALUATION COMPLEXITY: Low  PLAN:  PT FREQUENCY: 1x/week  PT DURATION: 8 weeks  PLANNED INTERVENTIONS: 97164- PT Re-evaluation, 97110-Therapeutic exercises, 97530- Therapeutic activity, 97112- Neuromuscular re-education, 97535- Self Care, 78295- Manual therapy, [phone]- Gait training, [phone]- Orthotic Fit/training, Patient/Family education, and Balance training  PLAN FOR NEXT SESSION: Aquatic HEP for mobility/general strengthening  Wait to schedule a couple other land visits after pt gets L AFO and botox    Sadie Haber, PT, DPT 12/23/2023, 11:29 PM

## 2023-12-28 ENCOUNTER — Telehealth: Payer: Self-pay | Admitting: Family Medicine

## 2023-12-28 NOTE — Telephone Encounter (Signed)
 Pt's mother dropped off disability parking placard application for this pt.  Placed in Dr. Penelope Coop box.  Please call pt when ready for p/u.  Pt saw Dr. Beryle Flock on 12/20/2023 and was not able to bring form back that day.

## 2023-12-29 NOTE — Telephone Encounter (Signed)
 Completed and given to front desk

## 2023-12-30 NOTE — Telephone Encounter (Signed)
 Called patient to update her that disability parking placard is complete and ready to be picked up on the same side as the lab

## 2024-01-05 ENCOUNTER — Telehealth: Payer: Self-pay | Admitting: Physical Therapy

## 2024-01-05 NOTE — Telephone Encounter (Addendum)
 This is PT's 3rd and final attempt to call mother regarding scheduling patient's remaining aquatic appts.  LVM this afternoon to call front office back to schedule aquatic appts with appropriate therapist.  Will place holds on schedule for pt should they call back.  Marilou Showman, PT, DPT

## 2024-01-16 ENCOUNTER — Encounter: Payer: Medicare Other | Admitting: Physical Medicine and Rehabilitation

## 2024-01-31 ENCOUNTER — Ambulatory Visit (INDEPENDENT_AMBULATORY_CARE_PROVIDER_SITE_OTHER): Admitting: Family Medicine

## 2024-01-31 ENCOUNTER — Encounter: Payer: Self-pay | Admitting: Family Medicine

## 2024-01-31 VITALS — BP 127/86 | HR 115 | Ht 60.0 in | Wt 192.0 lb

## 2024-01-31 DIAGNOSIS — I10 Essential (primary) hypertension: Secondary | ICD-10-CM | POA: Diagnosis not present

## 2024-01-31 DIAGNOSIS — R7303 Prediabetes: Secondary | ICD-10-CM | POA: Diagnosis not present

## 2024-01-31 DIAGNOSIS — G809 Cerebral palsy, unspecified: Secondary | ICD-10-CM | POA: Diagnosis not present

## 2024-01-31 DIAGNOSIS — E282 Polycystic ovarian syndrome: Secondary | ICD-10-CM | POA: Diagnosis not present

## 2024-01-31 DIAGNOSIS — R269 Unspecified abnormalities of gait and mobility: Secondary | ICD-10-CM

## 2024-01-31 DIAGNOSIS — G8194 Hemiplegia, unspecified affecting left nondominant side: Secondary | ICD-10-CM

## 2024-01-31 MED ORDER — SEMAGLUTIDE-WEIGHT MANAGEMENT 0.25 MG/0.5ML ~~LOC~~ SOAJ: 0.2500 mg | SUBCUTANEOUS | 0 refills | Status: AC

## 2024-01-31 MED ORDER — SEMAGLUTIDE-WEIGHT MANAGEMENT 0.5 MG/0.5ML ~~LOC~~ SOAJ: 0.5000 mg | SUBCUTANEOUS | 0 refills | Status: AC

## 2024-01-31 NOTE — Progress Notes (Unsigned)
 Established patient visit   Patient: Jasmin Reynolds   DOB: 12-Jun-2001   23 y.o. Female  MRN: [id number] Visit Date: 01/31/2024  Today's healthcare provider: Mimi Alt, MD   Chief Complaint  Patient presents with   Establish Care    Discuss weight    Subjective     HPI     Establish Care    Additional comments: Discuss weight       Last edited by Bart Lieu, CMA on 01/31/2024  1:21 PM.       Discussed the use of AI scribe software for clinical note transcription with the patient, who gave verbal consent to proceed.  History of Present Illness Jasmin Reynolds is a 23 year old female with cerebral palsy, PCOS, prediabetes, and hypertension who presents for weight management and transfer of care.  She currently weighs 192 pounds with a BMI of 37.5. She has been attempting weight loss through improved diet and exercise, including physical therapy sessions twice a month, both in water and on land. Despite these efforts, she has difficulty achieving the necessary intensity of exercise to promote weight loss, stating 'we don't ever get to that speed.'  Her medical history includes cerebral palsy, which has resulted in left hemiplegia, and she is legally blind. She attends physical therapy to aid in exercise and mobility and ambulates independently without the need for assistive devices. Her past medical history also includes prediabetes with an A1c of 5.7, hypertension currently managed with hydrochlorothiazide  12.5 mg daily, and polycystic ovary syndrome (PCOS).  There is no personal or family history of thyroid  disorders or thyroid  cancer. Her family history includes a great aunt diagnosed with breast cancer in her fifties. She is currently on Medicaid as secondary insurance, which may impact her access to certain weight management medications.     Past Medical History:  Diagnosis Date   Cerebral palsy (HCC)    Hydrocephalus (HCC)    Left  hemiplegia (HCC)    PCOS (polycystic ovarian syndrome)     Medications: Outpatient Medications Prior to Visit  Medication Sig   hydrochlorothiazide  (HYDRODIURIL ) 12.5 MG tablet Take 1 tablet (12.5 mg total) by mouth daily.   Multiple Vitamin (MULTIVITAMIN) capsule Take 1 capsule by mouth daily.   No facility-administered medications prior to visit.    Review of Systems  Last metabolic panel Lab Results  Component Value Date   GLUCOSE 97 01/31/2024   NA 141 01/31/2024   K 4.3 01/31/2024   CL 103 01/31/2024   CO2 21 01/31/2024   BUN 12 01/31/2024   CREATININE 0.94 01/31/2024   EGFR 88 01/31/2024   CALCIUM 9.7 01/31/2024   PROT 7.4 01/06/2023   ALBUMIN 4.4 01/06/2023   LABGLOB 3.0 01/06/2023   AGRATIO 1.5 01/06/2023   BILITOT 0.3 01/06/2023   ALKPHOS 86 01/06/2023   AST 21 01/06/2023   ALT 23 01/06/2023   Last lipids Lab Results  Component Value Date   CHOL 227 (H) 01/06/2023   HDL 55 01/06/2023   LDLCALC 140 (H) 01/06/2023   TRIG 180 (H) 01/06/2023   CHOLHDL 4.1 01/06/2023   Last hemoglobin A1c Lab Results  Component Value Date   HGBA1C 5.8 (H) 01/31/2024        Objective    BP 127/86   Pulse (!) 115   Ht 5' (1.524 m)   Wt 192 lb (87.1 kg)   SpO2 98%   BMI 37.50 kg/m  BP Readings from Last 3 Encounters:  01/31/24 127/86  12/20/23 (!) 127/90  11/24/23 (!) 127/90   Wt Readings from Last 3 Encounters:  01/31/24 192 lb (87.1 kg)  12/20/23 194 lb 6.4 oz (88.2 kg)  11/14/23 190 lb (86.2 kg)        Physical Exam Vitals reviewed.  Constitutional:      General: She is not in acute distress.    Appearance: Normal appearance. She is not ill-appearing.  Neck:     Thyroid : No thyroid  mass, thyromegaly or thyroid  tenderness.  Cardiovascular:     Rate and Rhythm: Normal rate and regular rhythm.  Pulmonary:     Effort: Pulmonary effort is normal. No respiratory distress.     Breath sounds: No wheezing, rhonchi or rales.  Neurological:     Mental  Status: She is alert and oriented to person, place, and time.  Psychiatric:        Mood and Affect: Mood normal.        Behavior: Behavior normal.       Results for orders placed or performed in visit on 01/31/24  [facility]  Result Value Ref Range   Glucose 97 70 - 99 mg/dL   BUN 12 6 - 20 mg/dL   Creatinine, Ser 1.61 0.57 - 1.00 mg/dL   eGFR 88 >09 UE/AVW/0.98   BUN/Creatinine Ratio 13 9 - 23   Sodium 141 134 - 144 mmol/L   Potassium 4.3 3.5 - 5.2 mmol/L   Chloride 103 96 - 106 mmol/L   CO2 21 20 - 29 mmol/L   Calcium 9.7 8.7 - 10.2 mg/dL  Hemoglobin J1B  Result Value Ref Range   Hgb A1c MFr Bld 5.8 (H) 4.8 - 5.6 %   Est. average glucose Bld gHb Est-mCnc 120 mg/dL  [id number]  Result Value Ref Range   TSH 0.955 0.450 - 4.500 uIU/mL   T3, Free 3.1 2.0 - 4.4 pg/mL   Free T4 1.43 0.82 - 1.77 ng/dL    Assessment & Plan     Problem List Items Addressed This Visit       Cardiovascular and Mediastinum   Primary hypertension - Primary   Chronic Hypertension managed with hydrochlorothiazide  12.5 mg daily. Blood pressure is well-controlled at 127/86 mmHg. Discussed importance of monitoring potassium levels and kidney function due to potential side effects of hydrochlorothiazide . - Continue hydrochlorothiazide  12.5 mg daily - Order basic metabolic panel to monitor kidney function and potassium levels - Refill hydrochlorothiazide  prescription      Relevant Orders   BMP8+EGFR (Completed)   Hemoglobin A1c (Completed)   TSH+T4F+T3Free (Completed)     Endocrine   PCOS (polycystic ovarian syndrome)   Relevant Orders   Hemoglobin A1c (Completed)     Nervous and Auditory   Left hemiplegia (HCC)   Left hemiplegia secondary to cerebral palsy. Managed with physical therapy, including water and land exercises twice a month to improve mobility and exercise capacity. - Continue physical therapy sessions twice a month      Cerebral palsy (HCC)   Cerebral palsy with associated  left hemiplegia. Management includes physical therapy to maintain mobility and function.        Other   Prediabetes   Prediabetes with an A1c of 5.7%. Emphasized importance of weight management to prevent progression to diabetes. Current management includes dietary changes and physical activity. - Order A1c test to monitor glucose control      Relevant Orders   Hemoglobin A1c (Completed)   Obesity, morbid (HCC)   Obesity with a BMI  of 37.5. Current management includes dietary modifications and physical therapy. Discussed potential use of Wegovy (semaglutide) for weight management, considering BMI and history of prediabetes and hypertension. Explained potential side effects, including thyroid  cancer risk based on animal studies. Emphasized need for insurance coverage and prior authorization for [employer]. - Check with insurance for coverage of Wegovy - If approved, initiate Wegovy 0.25 mg for one month, then increase dose as tolerated - Schedule follow-up appointment after starting Wegovy to assess tolerance and effectiveness      Relevant Medications   Semaglutide-Weight Management 0.25 MG/0.5ML SOAJ   Semaglutide-Weight Management 0.5 MG/0.5ML SOAJ (Start on 02/29/2024)   Other Relevant Orders   BMP8+EGFR (Completed)   Hemoglobin A1c (Completed)   TSH+T4F+T3Free (Completed)   Gait difficulty   Assessment & Plan        Return in about 6 weeks (around 03/13/2024) for Weight MGMT.         Mimi Alt, MD  [facility] [phone] (phone) [phone] (fax)  [facility]

## 2024-02-01 ENCOUNTER — Ambulatory Visit: Payer: Self-pay | Admitting: Family Medicine

## 2024-02-01 ENCOUNTER — Telehealth: Payer: Self-pay

## 2024-02-01 ENCOUNTER — Other Ambulatory Visit (HOSPITAL_COMMUNITY): Payer: Self-pay

## 2024-02-01 LAB — BMP8+EGFR
BUN/Creatinine Ratio: 13 (ref 9–23)
BUN: 12 mg/dL (ref 6–20)
CO2: 21 mmol/L (ref 20–29)
Calcium: 9.7 mg/dL (ref 8.7–10.2)
Chloride: 103 mmol/L (ref 96–106)
Creatinine, Ser: 0.94 mg/dL (ref 0.57–1.00)
Glucose: 97 mg/dL (ref 70–99)
Potassium: 4.3 mmol/L (ref 3.5–5.2)
Sodium: 141 mmol/L (ref 134–144)
eGFR: 88 mL/min/{1.73_m2} (ref >59)

## 2024-02-01 LAB — TSH+T4F+T3FREE
Free T4: 1.43 ng/dL (ref 0.82–1.77)
T3, Free: 3.1 pg/mL (ref 2.0–4.4)
TSH: 0.955 u[IU]/mL (ref 0.450–4.500)

## 2024-02-01 LAB — HEMOGLOBIN A1C
Est. average glucose Bld gHb Est-mCnc: 120 mg/dL
Hgb A1c MFr Bld: 5.8 % — ABNORMAL HIGH (ref 4.8–5.6)

## 2024-02-01 NOTE — Telephone Encounter (Signed)
 Pharmacy Patient Advocate Encounter   Received notification from Onbase that prior authorization for [facility] 0.25MG /0.5ML auto-injectors is required/requested.   Insurance verification completed.   The patient is insured through [facility] .   Per test claim: PA required; PA submitted to above mentioned insurance via CoverMyMeds Key/confirmation #/EOC [id number] Status is pending

## 2024-02-01 NOTE — Assessment & Plan Note (Signed)
 Cerebral palsy with associated left hemiplegia. Management includes physical therapy to maintain mobility and function.

## 2024-02-01 NOTE — Assessment & Plan Note (Signed)
 Obesity with a BMI of 37.5. Current management includes dietary modifications and physical therapy. Discussed potential use of Wegovy (semaglutide) for weight management, considering BMI and history of prediabetes and hypertension. Explained potential side effects, including thyroid  cancer risk based on animal studies. Emphasized need for insurance coverage and prior authorization for [employer]. - Check with insurance for coverage of Wegovy - If approved, initiate Wegovy 0.25 mg for one month, then increase dose as tolerated - Schedule follow-up appointment after starting [facility] to assess tolerance and effectiveness

## 2024-02-01 NOTE — Assessment & Plan Note (Signed)
 Left hemiplegia secondary to cerebral palsy. Managed with physical therapy, including water and land exercises twice a month to improve mobility and exercise capacity. - Continue physical therapy sessions twice a month

## 2024-02-01 NOTE — Assessment & Plan Note (Signed)
 Prediabetes with an A1c of 5.7%. Emphasized importance of weight management to prevent progression to diabetes. Current management includes dietary changes and physical activity. - Order A1c test to monitor glucose control

## 2024-02-01 NOTE — Assessment & Plan Note (Signed)
 Chronic Hypertension managed with hydrochlorothiazide  12.5 mg daily. Blood pressure is well-controlled at 127/86 mmHg. Discussed importance of monitoring potassium levels and kidney function due to potential side effects of hydrochlorothiazide . - Continue hydrochlorothiazide  12.5 mg daily - Order basic metabolic panel to monitor kidney function and potassium levels - Refill hydrochlorothiazide  prescription

## 2024-02-02 NOTE — Telephone Encounter (Signed)
 Please see the denial letter below

## 2024-02-02 NOTE — Telephone Encounter (Signed)
 Called and spoke to the pt mother to inform her of the message per Dr R, she wanted to know what's the next step or other options. She has been advised to reach out to her insurance to see if and what they will cover.

## 2024-02-02 NOTE — Telephone Encounter (Signed)
 Please notify patient's mother that Jasmin Reynolds was not approved for coverage.  Reason: not covered on Part D for insurance

## 2024-02-02 NOTE — Telephone Encounter (Signed)
 Pharmacy Patient Advocate Encounter  Received notification from [facility] that Prior Authorization for [facility] 0.25MG /0.5ML auto-injectors has been DENIED.  Full denial letter will be uploaded to the media tab. See denial reason below.   PA #/Case ID/Reference #: Jasmin Reynolds

## 2024-02-03 NOTE — Progress Notes (Deleted)
   Subjective:    Patient ID: Jasmin Reynolds, female    DOB: [birth year], 23 y.o.   MRN: [id number]  HPI   Pain Inventory Average Pain {NUMBERS; 0-10:5044} Pain Right Now {NUMBERS; 0-10:5044} My pain is {PAIN DESCRIPTION:21022940}  LOCATION OF PAIN  ***  BOWEL Number of stools per week: *** Oral laxative use {YES/NO:21197} Type of laxative *** Enema or suppository use {YES/NO:21197} History of colostomy {YES/NO:21197} Incontinent {YES/NO:21197}  BLADDER {bladder options:24190} In and out cath, frequency *** Able to self cath {YES/NO:21197} Bladder incontinence {YES/NO:21197} Frequent urination {YES/NO:21197} Leakage with coughing {YES/NO:21197} Difficulty starting stream {YES/NO:21197} Incomplete bladder emptying {YES/NO:21197}   Mobility {MOBILITY WUJ:81191478}  Function {FUNCTION:21022946}  Neuro/Psych {NEURO/PSYCH:21022948}  Prior Studies {CPRM PRIOR STUDIES:21022953}  Physicians involved in your care {CPRM PHYSICIANS INVOLVED IN YOUR CARE:21022954}   No family history on file. Social History   Socioeconomic History   Marital status: Single    Spouse name: Not on file   Number of children: Not on file   Years of education: Not on file   Highest education level: Not on file  Occupational History   Not on file  Tobacco Use   Smoking status: Never   Smokeless tobacco: Never  Vaping Use   Vaping status: Never Used  Substance and Sexual Activity   Alcohol use: Never   Drug use: Never   Sexual activity: Never  Other Topics Concern   Not on file  Social History Narrative   Not on file   Social Drivers of Health   Financial Resource Strain: Not on file  Food Insecurity: No Food Insecurity (01/31/2024)   Hunger Vital Sign    Worried About Running Out of Food in the Last Year: Never true    Ran Out of Food in the Last Year: Never true  Transportation Needs: No Transportation Needs (01/31/2024)   PRAPARE - Scientist, research (physical sciences) (Medical): No    Lack of Transportation (Non-Medical): No  Physical Activity: Not on file  Stress: No Stress Concern Present (01/31/2024)   [employer] of Occupational Health - Occupational Stress Questionnaire    Feeling of Stress : Not at all  Social Connections: Not on file   No past surgical history on file. Past Medical History:  Diagnosis Date   Cerebral palsy (HCC)    Hydrocephalus (HCC)    Left hemiplegia (HCC)    PCOS (polycystic ovarian syndrome)    There were no vitals taken for this visit.  Opioid Risk Score:   Fall Risk Score:  `1  Depression screen [facility] 2/9     01/31/2024    1:55 PM 06/22/2023    9:53 AM 01/06/2023   11:11 AM  Depression screen PHQ 2/9  Decreased Interest 0 0 0  Down, Depressed, Hopeless 0 0 1  PHQ - 2 Score 0 0 1  Altered sleeping 0 0 1  Tired, decreased energy 2 1 2   Change in appetite 1 2 2   Feeling bad or failure about yourself  0 0 0  Trouble concentrating 0 0 0  Moving slowly or fidgety/restless 0 0 0  Suicidal thoughts 0 0 0  PHQ-9 Score 3 3 6   Difficult doing work/chores Not difficult at all Somewhat difficult Not difficult at all    Review of Systems     Objective:   Physical Exam        Assessment & Plan:

## 2024-02-08 ENCOUNTER — Encounter: Attending: Physical Medicine and Rehabilitation | Admitting: Physical Medicine and Rehabilitation

## 2024-02-08 DIAGNOSIS — G802 Spastic hemiplegic cerebral palsy: Secondary | ICD-10-CM | POA: Insufficient documentation

## 2024-02-08 DIAGNOSIS — R269 Unspecified abnormalities of gait and mobility: Secondary | ICD-10-CM | POA: Insufficient documentation

## 2024-02-08 DIAGNOSIS — G8194 Hemiplegia, unspecified affecting left nondominant side: Secondary | ICD-10-CM | POA: Insufficient documentation

## 2024-02-23 ENCOUNTER — Other Ambulatory Visit: Payer: Self-pay | Admitting: Family Medicine

## 2024-06-10 ENCOUNTER — Other Ambulatory Visit: Payer: Self-pay | Admitting: Family Medicine

## 2024-09-08 ENCOUNTER — Other Ambulatory Visit: Payer: Self-pay | Admitting: Family Medicine
